# Patient Record
Sex: Male | Born: 1960 | Race: White | Hispanic: No | Marital: Married | Smoking: Current every day smoker
Health system: Southern US, Community
[De-identification: ages and names within clinical notes are randomized; demographics above are authoritative.]

## PROBLEM LIST (undated history)

## (undated) DIAGNOSIS — E785 Hyperlipidemia, unspecified: Secondary | ICD-10-CM

## (undated) DIAGNOSIS — I10 Essential (primary) hypertension: Secondary | ICD-10-CM

## (undated) DIAGNOSIS — K760 Fatty (change of) liver, not elsewhere classified: Secondary | ICD-10-CM

## (undated) DIAGNOSIS — K769 Liver disease, unspecified: Secondary | ICD-10-CM

## (undated) DIAGNOSIS — I2510 Atherosclerotic heart disease of native coronary artery without angina pectoris: Secondary | ICD-10-CM

## (undated) DIAGNOSIS — J449 Chronic obstructive pulmonary disease, unspecified: Secondary | ICD-10-CM

## (undated) DIAGNOSIS — I4510 Unspecified right bundle-branch block: Secondary | ICD-10-CM

## (undated) HISTORY — PX: OTHER SURGICAL HISTORY: SHX169

## (undated) HISTORY — PX: CYST EXCISION: SHX5701

---

## 2001-06-05 ENCOUNTER — Ambulatory Visit (HOSPITAL_BASED_OUTPATIENT_CLINIC_OR_DEPARTMENT_OTHER): Admission: RE | Admit: 2001-06-05 | Discharge: 2001-06-05 | Payer: Self-pay | Admitting: Plastic Surgery

## 2005-01-27 ENCOUNTER — Ambulatory Visit (HOSPITAL_BASED_OUTPATIENT_CLINIC_OR_DEPARTMENT_OTHER): Admission: RE | Admit: 2005-01-27 | Discharge: 2005-01-27 | Payer: Self-pay | Admitting: Plastic Surgery

## 2006-02-20 ENCOUNTER — Encounter: Admission: RE | Admit: 2006-02-20 | Discharge: 2006-02-20 | Payer: Self-pay | Admitting: Emergency Medicine

## 2010-06-14 ENCOUNTER — Other Ambulatory Visit: Payer: Self-pay | Admitting: Plastic Surgery

## 2011-04-18 ENCOUNTER — Other Ambulatory Visit: Payer: Self-pay | Admitting: Plastic Surgery

## 2011-09-05 ENCOUNTER — Other Ambulatory Visit: Payer: Self-pay | Admitting: Plastic Surgery

## 2012-11-21 ENCOUNTER — Other Ambulatory Visit: Payer: Self-pay | Admitting: Plastic Surgery

## 2013-06-28 ENCOUNTER — Ambulatory Visit: Payer: Self-pay | Admitting: Cardiology

## 2013-07-12 ENCOUNTER — Encounter: Payer: Self-pay | Admitting: Cardiology

## 2013-12-27 ENCOUNTER — Other Ambulatory Visit: Payer: Self-pay | Admitting: Gastroenterology

## 2015-05-25 ENCOUNTER — Other Ambulatory Visit: Payer: Self-pay | Admitting: Plastic Surgery

## 2015-07-14 ENCOUNTER — Other Ambulatory Visit: Payer: Self-pay | Admitting: Family Medicine

## 2015-07-14 DIAGNOSIS — D696 Thrombocytopenia, unspecified: Secondary | ICD-10-CM

## 2015-07-21 ENCOUNTER — Other Ambulatory Visit: Payer: Self-pay | Admitting: Family Medicine

## 2015-07-21 DIAGNOSIS — R0600 Dyspnea, unspecified: Secondary | ICD-10-CM

## 2015-07-22 ENCOUNTER — Ambulatory Visit (INDEPENDENT_AMBULATORY_CARE_PROVIDER_SITE_OTHER): Payer: BLUE CROSS/BLUE SHIELD | Admitting: Internal Medicine

## 2015-07-22 DIAGNOSIS — R0600 Dyspnea, unspecified: Secondary | ICD-10-CM | POA: Diagnosis not present

## 2015-07-22 LAB — PULMONARY FUNCTION TEST
DL/VA % pred: 77 %
DL/VA: 3.73 ml/min/mmHg/L
DLCO cor % pred: 80 %
DLCO cor: 29.18 ml/min/mmHg
DLCO unc % pred: 82 %
DLCO unc: 30.04 ml/min/mmHg
FEF 25-75 Post: 2.16 L/sec
FEF 25-75 Pre: 1.64 L/sec
FEF2575-%Change-Post: 31 %
FEF2575-%Pred-Post: 61 %
FEF2575-%Pred-Pre: 46 %
FEV1-%Change-Post: 10 %
FEV1-%Pred-Post: 83 %
FEV1-%Pred-Pre: 75 %
FEV1-Post: 3.49 L
FEV1-Pre: 3.15 L
FEV1FVC-%Change-Post: 10 %
FEV1FVC-%Pred-Pre: 78 %
FEV6-%Change-Post: 0 %
FEV6-%Pred-Post: 98 %
FEV6-%Pred-Pre: 98 %
FEV6-Post: 5.16 L
FEV6-Pre: 5.13 L
FEV6FVC-%Change-Post: 0 %
FEV6FVC-%Pred-Post: 102 %
FEV6FVC-%Pred-Pre: 102 %
FVC-%Change-Post: 0 %
FVC-%Pred-Post: 96 %
FVC-%Pred-Pre: 96 %
FVC-Post: 5.25 L
FVC-Pre: 5.23 L
Post FEV1/FVC ratio: 67 %
Post FEV6/FVC ratio: 98 %
Pre FEV1/FVC ratio: 60 %
Pre FEV6/FVC Ratio: 98 %
RV % pred: 147 %
RV: 3.39 L
TLC % pred: 116 %
TLC: 8.8 L

## 2015-07-22 NOTE — Progress Notes (Signed)
PFT done today. 

## 2015-07-27 ENCOUNTER — Ambulatory Visit
Admission: RE | Admit: 2015-07-27 | Discharge: 2015-07-27 | Disposition: A | Discharge status: Home / Self Care | Payer: BLUE CROSS/BLUE SHIELD | Source: Ambulatory Visit | Attending: Family Medicine | Admitting: Family Medicine

## 2015-07-27 DIAGNOSIS — D696 Thrombocytopenia, unspecified: Secondary | ICD-10-CM

## 2015-08-03 ENCOUNTER — Other Ambulatory Visit: Payer: Self-pay | Admitting: Family Medicine

## 2015-08-03 DIAGNOSIS — R160 Hepatomegaly, not elsewhere classified: Secondary | ICD-10-CM

## 2015-08-11 ENCOUNTER — Ambulatory Visit
Admission: RE | Admit: 2015-08-11 | Discharge: 2015-08-11 | Disposition: A | Discharge status: Home / Self Care | Payer: BLUE CROSS/BLUE SHIELD | Source: Ambulatory Visit | Attending: Family Medicine | Admitting: Family Medicine

## 2015-08-11 DIAGNOSIS — R160 Hepatomegaly, not elsewhere classified: Secondary | ICD-10-CM

## 2015-08-11 MED ORDER — GADOBENATE DIMEGLUMINE 529 MG/ML IV SOLN
17.0000 mL | Freq: Once | INTRAVENOUS | Status: AC | PRN
  Administered 2015-08-11: 17 mL via INTRAVENOUS

## 2016-07-14 ENCOUNTER — Other Ambulatory Visit: Payer: Self-pay | Admitting: Plastic Surgery

## 2016-08-26 ENCOUNTER — Telehealth: Payer: Self-pay | Admitting: Acute Care

## 2016-08-29 NOTE — Telephone Encounter (Signed)
LMTC x 1  

## 2016-09-05 NOTE — Telephone Encounter (Signed)
LMTC x 1  

## 2016-09-09 NOTE — Telephone Encounter (Signed)
Spoke with pt.  He is to call [employer] to check for coverage for [facility] and CT and will call me back.

## 2016-09-29 NOTE — Telephone Encounter (Signed)
Spoke with pt and he states he has not had time to contact [employer] and will try to do this asap.  Will await call back

## 2016-11-03 ENCOUNTER — Other Ambulatory Visit: Payer: Self-pay | Admitting: Acute Care

## 2016-11-03 DIAGNOSIS — F17210 Nicotine dependence, cigarettes, uncomplicated: Secondary | ICD-10-CM

## 2016-11-18 ENCOUNTER — Ambulatory Visit (INDEPENDENT_AMBULATORY_CARE_PROVIDER_SITE_OTHER): Payer: BLUE CROSS/BLUE SHIELD | Admitting: Acute Care

## 2016-11-18 ENCOUNTER — Ambulatory Visit
Admission: RE | Admit: 2016-11-18 | Discharge: 2016-11-18 | Disposition: A | Discharge status: Home / Self Care | Payer: BLUE CROSS/BLUE SHIELD | Source: Ambulatory Visit | Attending: Acute Care | Admitting: Acute Care

## 2016-11-18 ENCOUNTER — Encounter: Payer: Self-pay | Admitting: Acute Care

## 2016-11-18 DIAGNOSIS — F17210 Nicotine dependence, cigarettes, uncomplicated: Secondary | ICD-10-CM

## 2016-11-18 NOTE — Progress Notes (Signed)
Shared Decision Making Visit Lung Cancer Screening Program [id number])   Eligibility:  Age 56 y.o.  Pack Years Smoking History Calculation 80 pack year smoking history (# packs/per year x # years smoked)  Recent History of coughing up blood  no  Unexplained weight loss? no ( >Than 15 pounds within the last 6 months )  Prior History Lung / other cancer no (Diagnosis within the last 5 years already requiring surveillance chest CT Scans).  Smoking Status Current Smoker  Former Smokers: Years since quit: NA  Quit Date: NA  Visit Components:  Discussion included one or more decision making aids. yes  Discussion included risk/benefits of screening. yes  Discussion included potential follow up diagnostic testing for abnormal scans. yes  Discussion included meaning and risk of over diagnosis. yes  Discussion included meaning and risk of False Positives. yes  Discussion included meaning of total radiation exposure. yes  Counseling Included:  Importance of adherence to annual lung cancer LDCT screening. yes  Impact of comorbidities on ability to participate in the program. yes  Ability and willingness to under diagnostic treatment. yes  Smoking Cessation Counseling:  Current Smokers:   Discussed importance of smoking cessation. yes  Information about tobacco cessation classes and interventions provided to patient. yes  Patient provided with "ticket" for LDCT Scan. yes  Symptomatic Patient. no  Counseling  Diagnosis Code: Tobacco Use Z72.0  Asymptomatic Patient yes  Counseling (Intermediate counseling: > three minutes counseling) Y8144  Former Smokers:   Discussed the importance of maintaining cigarette abstinence. yes  Diagnosis Code: Personal History of Nicotine Dependence. Y18.563  Information about tobacco cessation classes and interventions provided to patient. Yes  Patient provided with "ticket" for LDCT Scan. yes  Written Order for Lung Cancer  Screening with LDCT placed in Epic. Yes (CT Chest Lung Cancer Screening Low Dose W/O CM) [id number] Z12.2-Screening of respiratory organs Z87.891-Personal history of nicotine dependence  I have spent 25 minutes of face to face time with Mr. Boulay discussing the risks and benefits of lung cancer screening. We viewed a power point together that explained in detail the above noted topics. We paused at intervals to allow for questions to be asked and answered to ensure understanding.We discussed that the single most powerful action that he can take to decrease his risk of developing lung cancer is to quit smoking. We discussed whether or not he is ready to commit to setting a quit date. We discussed options for tools to aid in quitting smoking including nicotine replacement therapy, non-nicotine medications, support groups, Quit Smart classes, and behavior modification. We discussed that often times setting smaller, more achievable goals, such as eliminating 1 cigarette a day for a week and then 2 cigarettes a day for a week can be helpful in slowly decreasing the number of cigarettes smoked. This allows for a sense of accomplishment as well as providing a clinical benefit. I gave him the " Be Stronger Than Your Excuses" card with contact information for community resources, classes, free nicotine replacement therapy, and access to mobile apps, text messaging, and on-line smoking cessation help. I have also given him my card and contact information in the event he needs to contact me. We discussed the time and location of the scan, and that either Doroteo Glassman RN or I will call with the results within 24-48 hours of receiving them. I have offered him  a copy of the power point we viewed  as a resource in the event they need reinforcement of  the concepts we discussed today in the office. The patient verbalized understanding of all of  the above and had no further questions upon leaving the office. They have my  contact information in the event they have any further questions.  I spent 4 minutes counseling on smoking cessation and the health risks of continued tobacco abuse.  I explained to the patient that there has been a high incidence of coronary artery disease noted on these exams. I explained that this is a non-gated exam therefore degree or severity cannot be determined. This patient is not on statin therapy. I have asked the patient to follow-up with their PCP regarding any incidental finding of coronary artery disease and management with diet or medication as their PCP  feels is clinically indicated. The patient verbalized understanding of the above and had no further questions upon completion of the visit.      Magdalen Spatz, NP 11/18/2016

## 2016-11-24 ENCOUNTER — Other Ambulatory Visit: Payer: Self-pay | Admitting: Acute Care

## 2016-11-24 DIAGNOSIS — F17210 Nicotine dependence, cigarettes, uncomplicated: Secondary | ICD-10-CM

## 2017-04-16 ENCOUNTER — Observation Stay (HOSPITAL_COMMUNITY)
Admission: EM | Admit: 2017-04-16 | Discharge: 2017-04-17 | Disposition: A | Discharge status: Home / Self Care | Payer: BLUE CROSS/BLUE SHIELD | Attending: Internal Medicine | Admitting: Internal Medicine

## 2017-04-16 ENCOUNTER — Encounter (HOSPITAL_COMMUNITY): Payer: Self-pay | Admitting: *Deleted

## 2017-04-16 ENCOUNTER — Other Ambulatory Visit: Payer: Self-pay

## 2017-04-16 DIAGNOSIS — I10 Essential (primary) hypertension: Secondary | ICD-10-CM | POA: Insufficient documentation

## 2017-04-16 DIAGNOSIS — I4510 Unspecified right bundle-branch block: Secondary | ICD-10-CM | POA: Diagnosis not present

## 2017-04-16 DIAGNOSIS — F17210 Nicotine dependence, cigarettes, uncomplicated: Secondary | ICD-10-CM | POA: Diagnosis not present

## 2017-04-16 DIAGNOSIS — Z79899 Other long term (current) drug therapy: Secondary | ICD-10-CM | POA: Insufficient documentation

## 2017-04-16 DIAGNOSIS — I9589 Other hypotension: Secondary | ICD-10-CM | POA: Diagnosis not present

## 2017-04-16 DIAGNOSIS — I959 Hypotension, unspecified: Secondary | ICD-10-CM | POA: Diagnosis not present

## 2017-04-16 DIAGNOSIS — E860 Dehydration: Secondary | ICD-10-CM | POA: Diagnosis present

## 2017-04-16 DIAGNOSIS — I2510 Atherosclerotic heart disease of native coronary artery without angina pectoris: Secondary | ICD-10-CM | POA: Insufficient documentation

## 2017-04-16 DIAGNOSIS — E876 Hypokalemia: Secondary | ICD-10-CM | POA: Insufficient documentation

## 2017-04-16 DIAGNOSIS — K529 Noninfective gastroenteritis and colitis, unspecified: Secondary | ICD-10-CM | POA: Diagnosis not present

## 2017-04-16 DIAGNOSIS — F1090 Alcohol use, unspecified, uncomplicated: Secondary | ICD-10-CM | POA: Diagnosis present

## 2017-04-16 DIAGNOSIS — E785 Hyperlipidemia, unspecified: Secondary | ICD-10-CM | POA: Insufficient documentation

## 2017-04-16 DIAGNOSIS — E861 Hypovolemia: Secondary | ICD-10-CM

## 2017-04-16 DIAGNOSIS — D696 Thrombocytopenia, unspecified: Secondary | ICD-10-CM | POA: Diagnosis not present

## 2017-04-16 DIAGNOSIS — Z789 Other specified health status: Secondary | ICD-10-CM

## 2017-04-16 DIAGNOSIS — J449 Chronic obstructive pulmonary disease, unspecified: Secondary | ICD-10-CM | POA: Diagnosis present

## 2017-04-16 DIAGNOSIS — N179 Acute kidney failure, unspecified: Secondary | ICD-10-CM | POA: Diagnosis not present

## 2017-04-16 DIAGNOSIS — E871 Hypo-osmolality and hyponatremia: Secondary | ICD-10-CM | POA: Diagnosis not present

## 2017-04-16 DIAGNOSIS — E872 Acidosis, unspecified: Secondary | ICD-10-CM | POA: Diagnosis present

## 2017-04-16 DIAGNOSIS — Z720 Tobacco use: Secondary | ICD-10-CM | POA: Diagnosis not present

## 2017-04-16 DIAGNOSIS — Z7289 Other problems related to lifestyle: Secondary | ICD-10-CM | POA: Diagnosis present

## 2017-04-16 HISTORY — DX: Essential (primary) hypertension: I10

## 2017-04-16 HISTORY — DX: Hyperlipidemia, unspecified: E78.5

## 2017-04-16 HISTORY — DX: Liver disease, unspecified: K76.9

## 2017-04-16 HISTORY — DX: Unspecified right bundle-branch block: I45.10

## 2017-04-16 HISTORY — DX: Chronic obstructive pulmonary disease, unspecified: J44.9

## 2017-04-16 HISTORY — DX: Fatty (change of) liver, not elsewhere classified: K76.0

## 2017-04-16 HISTORY — DX: Atherosclerotic heart disease of native coronary artery without angina pectoris: I25.10

## 2017-04-16 LAB — COMPREHENSIVE METABOLIC PANEL
ALT: 23 U/L (ref 17–63)
AST: 26 U/L (ref 15–41)
Albumin: 3.7 g/dL (ref 3.5–5.0)
Alkaline Phosphatase: 70 U/L (ref 38–126)
Anion gap: 23 — ABNORMAL HIGH (ref 5–15)
BUN: 74 mg/dL — ABNORMAL HIGH (ref 6–20)
CO2: 17 mmol/L — ABNORMAL LOW (ref 22–32)
Calcium: 8.9 mg/dL (ref 8.9–10.3)
Chloride: 86 mmol/L — ABNORMAL LOW (ref 101–111)
Creatinine, Ser: 6.71 mg/dL — ABNORMAL HIGH (ref 0.61–1.24)
GFR calc Af Amer: 10 mL/min — ABNORMAL LOW (ref >60)
GFR calc non Af Amer: 8 mL/min — ABNORMAL LOW (ref >60)
Glucose, Bld: 124 mg/dL — ABNORMAL HIGH (ref 65–99)
Potassium: 3.6 mmol/L (ref 3.5–5.1)
Sodium: 126 mmol/L — ABNORMAL LOW (ref 135–145)
Total Bilirubin: 0.7 mg/dL (ref 0.3–1.2)
Total Protein: 8.2 g/dL — ABNORMAL HIGH (ref 6.5–8.1)

## 2017-04-16 LAB — BASIC METABOLIC PANEL
Anion gap: 18 — ABNORMAL HIGH (ref 5–15)
BUN: 73 mg/dL — ABNORMAL HIGH (ref 6–20)
CO2: 18 mmol/L — ABNORMAL LOW (ref 22–32)
Calcium: 8.3 mg/dL — ABNORMAL LOW (ref 8.9–10.3)
Chloride: 93 mmol/L — ABNORMAL LOW (ref 101–111)
Creatinine, Ser: 5.22 mg/dL — ABNORMAL HIGH (ref 0.61–1.24)
GFR calc Af Amer: 13 mL/min — ABNORMAL LOW (ref >60)
GFR calc non Af Amer: 11 mL/min — ABNORMAL LOW (ref >60)
Glucose, Bld: 110 mg/dL — ABNORMAL HIGH (ref 65–99)
Potassium: 3.6 mmol/L (ref 3.5–5.1)
Sodium: 129 mmol/L — ABNORMAL LOW (ref 135–145)

## 2017-04-16 LAB — CBC
HCT: 40.5 % (ref 39.0–52.0)
Hemoglobin: 14.9 g/dL (ref 13.0–17.0)
MCH: 35.5 pg — ABNORMAL HIGH (ref 26.0–34.0)
MCHC: 36.8 g/dL — ABNORMAL HIGH (ref 30.0–36.0)
MCV: 96.4 fL (ref 78.0–100.0)
Platelets: 203 10*3/uL (ref 150–400)
RBC: 4.2 MIL/uL — ABNORMAL LOW (ref 4.22–5.81)
RDW: 12.7 % (ref 11.5–15.5)
WBC: 10.2 10*3/uL (ref 4.0–10.5)

## 2017-04-16 LAB — LIPASE, BLOOD: Lipase: 75 U/L — ABNORMAL HIGH (ref 11–51)

## 2017-04-16 LAB — I-STAT CG4 LACTIC ACID, ED: Lactic Acid, Venous: 1.06 mmol/L (ref 0.5–1.9)

## 2017-04-16 MED ORDER — SODIUM CHLORIDE 0.9 % IV SOLN
INTRAVENOUS | Status: AC
  Administered 2017-04-16: 19:00:00 via INTRAVENOUS

## 2017-04-16 MED ORDER — ATORVASTATIN CALCIUM 10 MG PO TABS
10.0000 mg | ORAL_TABLET | Freq: Every day | ORAL | Status: DC
  Administered 2017-04-17: 10 mg via ORAL
  Filled 2017-04-16: qty 1

## 2017-04-16 MED ORDER — NICOTINE 21 MG/24HR TD PT24
21.0000 mg | MEDICATED_PATCH | Freq: Every day | TRANSDERMAL | Status: DC
  Filled 2017-04-16: qty 1

## 2017-04-16 MED ORDER — ACETAMINOPHEN 650 MG RE SUPP
650.0000 mg | Freq: Four times a day (QID) | RECTAL | Status: DC | PRN
Start: 2017-04-16 — End: 2017-04-17

## 2017-04-16 MED ORDER — THIAMINE HCL 100 MG/ML IJ SOLN
Freq: Once | INTRAVENOUS | Status: AC
  Administered 2017-04-16: 21:00:00 via INTRAVENOUS
  Filled 2017-04-16: qty 1000

## 2017-04-16 MED ORDER — BOOST / RESOURCE BREEZE PO LIQD CUSTOM
1.0000 | Freq: Three times a day (TID) | ORAL | Status: DC
  Filled 2017-04-16 (×6): qty 1

## 2017-04-16 MED ORDER — THIAMINE HCL 100 MG/ML IJ SOLN: 100.0000 mg | Freq: Every day | INTRAMUSCULAR | Status: DC

## 2017-04-16 MED ORDER — SODIUM CHLORIDE 0.9 % IV BOLUS (SEPSIS)
1000.0000 mL | Freq: Once | INTRAVENOUS | Status: AC
  Administered 2017-04-16: 1000 mL via INTRAVENOUS

## 2017-04-16 MED ORDER — ONDANSETRON HCL 4 MG PO TABS
4.0000 mg | ORAL_TABLET | Freq: Four times a day (QID) | ORAL | Status: DC | PRN
  Administered 2017-04-16 – 2017-04-17 (×2): 4 mg via ORAL
  Filled 2017-04-16 (×2): qty 1

## 2017-04-16 MED ORDER — VITAMIN B-1 100 MG PO TABS
100.0000 mg | ORAL_TABLET | Freq: Every day | ORAL | Status: DC
  Administered 2017-04-17: 100 mg via ORAL
  Filled 2017-04-16: qty 1

## 2017-04-16 MED ORDER — ENOXAPARIN SODIUM 30 MG/0.3ML ~~LOC~~ SOLN
30.0000 mg | SUBCUTANEOUS | Status: DC
  Administered 2017-04-16: 30 mg via SUBCUTANEOUS
  Filled 2017-04-16: qty 0.3

## 2017-04-16 MED ORDER — ONDANSETRON HCL 4 MG/2ML IJ SOLN: 4.0000 mg | Freq: Four times a day (QID) | INTRAMUSCULAR | Status: DC | PRN

## 2017-04-16 MED ORDER — ONDANSETRON HCL 4 MG/2ML IJ SOLN: 4.0000 mg | Freq: Once | INTRAMUSCULAR | Status: DC | PRN

## 2017-04-16 MED ORDER — ACETAMINOPHEN 325 MG PO TABS: 650.0000 mg | ORAL_TABLET | Freq: Four times a day (QID) | ORAL | Status: DC | PRN

## 2017-04-16 MED ORDER — SODIUM CHLORIDE 0.9 % IV SOLN: INTRAVENOUS | Status: DC

## 2017-04-16 MED ORDER — FOLIC ACID 1 MG PO TABS
1.0000 mg | ORAL_TABLET | Freq: Every day | ORAL | Status: DC
  Administered 2017-04-17: 1 mg via ORAL
  Filled 2017-04-16: qty 1

## 2017-04-16 MED ORDER — ADULT MULTIVITAMIN W/MINERALS CH
1.0000 | ORAL_TABLET | Freq: Every day | ORAL | Status: DC
  Administered 2017-04-17: 1 via ORAL
  Filled 2017-04-16: qty 1

## 2017-04-16 MED ORDER — LORAZEPAM 2 MG/ML IJ SOLN: 1.0000 mg | Freq: Four times a day (QID) | INTRAMUSCULAR | Status: DC | PRN

## 2017-04-16 MED ORDER — ALBUTEROL SULFATE (2.5 MG/3ML) 0.083% IN NEBU: 2.5000 mg | INHALATION_SOLUTION | RESPIRATORY_TRACT | Status: DC | PRN

## 2017-04-16 MED ORDER — OXYCODONE HCL 5 MG PO TABS: 5.0000 mg | ORAL_TABLET | ORAL | Status: DC | PRN

## 2017-04-16 MED ORDER — LORAZEPAM 1 MG PO TABS
1.0000 mg | ORAL_TABLET | Freq: Four times a day (QID) | ORAL | Status: DC | PRN
  Administered 2017-04-16 – 2017-04-17 (×2): 1 mg via ORAL
  Filled 2017-04-16 (×2): qty 1

## 2017-04-16 NOTE — ED Triage Notes (Signed)
The pt is c/o n v diarrhea since Wednesday with chills  He was a little better this am  The approx 1400 he tried to eat but was unable to hold food down  He feels dehydrated  bp low

## 2017-04-16 NOTE — ED Notes (Signed)
Hospitalist at bedside 

## 2017-04-16 NOTE — H&P (Addendum)
History and Physical    Randy DIDONATO YQM:[id number] DOB: [birth year] DOA: 04/16/2017  PCP: Lujean Amel, MD   I have briefly reviewed patients previous medical reports in [facility].  Patient coming from: Home  Chief Complaint: Abdominal pain, nausea, vomiting, diarrhea.  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 History was obtained from patient and his spouse and daughter at bedside. Patient was in his usual state of health until 4 days ago. He ate a fish sandwich with tar tar sauce at noontime on Wednesday prior to admission. Approximately 4 hours later, he started feeling abdominal discomfort. Over the course of that night, he developed severe diffuse cramping abdominal pain associated with severe chills but no nausea, vomiting, fever. He was unable to sleep that night and did not eat anything. The next day he developed severe and multiple episodes of diarrhea which was watery, yellowish discoloration without mucus or blood, multiple episodes over the next 24 hours. Simultaneously his abdominal pain was gradually decreasing. His diarrhea then subsided but he started having nonbloody emesis every couple of hours. He tried to stay hydrated by drinking electrolyte drinks. 2 nights ago he felt somewhat better and was able to sleep through the course of the night without vomiting or diarrhea. Since yesterday when he tried to eat or drink anything other than water, he would develop nausea and vomiting and unable to keep anything down. He's been drinking only water and tea with sugar. He has been taking his prescription antihypertensives. His last episode of diarrhea was this morning and vomiting earlier in the afternoon. He currently feels hungry. He feels weak,  at times dizzy and lightheaded but no passing out. His urine output has progressively decreased over the last couple of days. His wife and daughter insisted on him coming to the ED for evaluation yesterday but patient declined. Since he was unable to keep anything down, he finally came to the ED.  ED Course: Noted to be hypotensive and was resuscitated with 2 L of normal saline and blood pressures have improved from SBP of 80s to 113/50 mmHg at bedside. Lab work significant for sodium of 126 and creatinine greater than 6. Overall patient's feels better.  Review of Systems:  All other systems reviewed and apart from HPI, are negative. No chest pain, dyspnea or palpitations.  Past Medical History:  Diagnosis Date  . Coronary artery disease   . Hypertension   . Liver disease     History reviewed. No pertinent surgical history.  Social History  reports that he has been smoking.  He has a 80.00 pack-year smoking history. he has never used smokeless tobacco. He reports that he drinks alcohol. His drug history is not on file.   Patient smokes 1.5 pack of cigarettes per day for several years. He drinks a sixpack of beers daily and has been doing so since the mid 80s. He denies drinking more on weekends or during hard liquor. He denies drug abuse. He last drank alcohol 4 days ago.  No Known Allergies  History reviewed. No pertinent family history. Father: Liver cancer. Lung Cancer.  Mother: Lung cancer Parents died of Heart Attack at age 26. Another brother had a stroke at age 57.  Prior to Admission medications  Medication Sig Start Date End Date Taking? Authorizing Provider  atorvastatin (LIPITOR) 10 MG tablet Take 10 mg by mouth daily. 03/25/17  Yes [provider]  fluticasone (FLONASE) 50 MCG/ACT nasal spray Place 2 sprays into both nostrils daily as needed for rhinitis.  04/12/17  Yes [provider]  irbesartan (AVAPRO) 75 MG tablet Take 75 mg by mouth daily. 03/14/17   Yes [provider]    Physical Exam: Vitals:   04/16/17 1715 04/16/17 1730 04/16/17 1745 04/16/17 1800  BP: 105/87 (!) 113/50 112/71 113/75  Pulse: 93 91 90 88  Resp: 18 18 17 14   Temp:      TempSrc:      SpO2: 100% 99% 100% 100%  Weight:      Height:          Constitutional: Pleasant middle-aged male moderately built and nourished lying comfortably supine in bed. Does not look septic or toxic. Eyes: PERTLA, lids and conjunctivae normal ENMT: Mucous membranes are dry. Posterior pharynx clear of any exudate or lesions. Normal dentition.  Neck: supple, no masses, no thyromegaly Respiratory: clear to auscultation bilaterally, no wheezing, no crackles. Normal respiratory effort. No accessory muscle use.  Cardiovascular: S1 & S2 heard, regular rate and rhythm, no murmurs / rubs / gallops. No extremity edema. 2+ pedal pulses. No carotid bruits. Telemetry: SR. Abdomen: No distension, no tenderness, no masses palpated. No hepatosplenomegaly. Bowel sounds normal.  Musculoskeletal: no clubbing / cyanosis. No joint deformity upper and lower extremities. Good ROM, no contractures. Normal muscle tone.  Skin: no rashes, lesions, ulcers. No induration Neurologic: CN 2-12 grossly intact. Sensation intact, DTR normal. Strength 5/5 in all 4 limbs.  Psychiatric: Normal judgment and insight. Alert and oriented x 3. Normal mood.     Labs on Admission: I have personally reviewed following labs and imaging studies  CBC: Recent Labs  Lab 04/16/17 1520  WBC 10.2  HGB 14.9  HCT 40.5  MCV 96.4  PLT 182   Basic Metabolic Panel: Recent Labs  Lab 04/16/17 1520  NA 126*  K 3.6  CL 86*  CO2 17*  GLUCOSE 124*  BUN 74*  CREATININE 6.71*  CALCIUM 8.9   Liver Function Tests: Recent Labs  Lab 04/16/17 1520  AST 26  ALT 23  ALKPHOS 70  BILITOT 0.7  PROT 8.2*  ALBUMIN 3.7     Radiological Exams on Admission: No results found.  EKG: Independently reviewed. SR, RBBB (not  new), QTC 356 msecs. No acute changes.  Assessment/Plan Principal Problem:   Acute renal failure (ARF) (HCC) Active Problems:   Acute gastroenteritis   Dehydration with hyponatremia   Metabolic acidosis   Tobacco abuse   Alcohol use   RBBB   Hypotension   Essential hypertension   COPD (chronic obstructive pulmonary disease) ([place])     1. Acute renal failure, possibly oliguric: Precipitated by dehydration from GI losses, possible ATN from hypotension and ARB. Presented with creatinine of 6.7 (creatinine was 0.9 in early October 2018 as seen in patients my chart). Hold ARB. Check FeNA. IV fluids, strict intake output charting and follow BMP every 6 hourly. Expect full recovery. If does not improve or worsens, consider renal ultrasound and nephrology consultation. Metabolic acidosis likely related to this. No hyperkalemia or other indications for urgent dialysis. 2. Anion gap metabolic acidosis: Anion gap 23 on admission. Lactate normal. Likely related to acute renal failure. Management as above. Follow BMP. 3. Acute gastroenteritis versus food poisoning: Clinically improving. No history of  antibiotic exposure. Start with clear liquid diet and advance as tolerated. Enteric precautions. If diarrhea or vomiting get worse, then consider testing. 4. Dehydration with hyponatremia: Patient and family report chronic hyponatremia but last sodium in October 2018 was 135. IV normal saline and follow BMP every 6 hours. Aim to correct sodium by no greater than 8 - 10 mEq in 24 hours. 5. Hypotension: Secondary to dehydration and antihypertensives. Hold ARB. Improving with hydration. 6. Hyperlipidemia: Continue statins. 7. Alcohol use: CIWA protocol. 8. Tobacco abuse: Cessation counseled. He agrees to nicotine patch. 9. COPD: Stable without clinical bronchospasm.   DVT prophylaxis: Lovenox  Code Status: Full  Family Communication: Discussed in detail with patient's spouse and daughter at bedside.    Disposition Plan: DC home when medically improved.  Consults called: None  Admission status: Observation, medical bed.  Severity of Illness: The appropriate patient status for this patient is OBSERVATION. Observation status is judged to be reasonable and necessary in order to provide the required intensity of service to ensure the patient's safety. The patient's presenting symptoms, physical exam findings, and initial radiographic and laboratory data in the context of their medical condition is felt to place them at decreased risk for further clinical deterioration. Furthermore, it is anticipated that the patient will be medically stable for discharge from the hospital within 2 midnights of admission. The following factors support the patient status of observation.   " The patient's presenting symptoms include nausea, vomiting, diarrhea, abdominal pain and chills. " The physical exam findings include dry mucosa, hypotensive in ED. " The initial radiographic and laboratory data are sodium 126, bicarbonate 17, anion gap 23, BUN 74, creatinine 6.7.       Vernell Leep MD [facility] Pager [phone]  If 7PM-7AM, please contact night-coverage [email or web address] Password [email or web address]  04/16/2017, 6:18 PM

## 2017-04-16 NOTE — ED Provider Notes (Signed)
[facility] EMERGENCY DEPARTMENT Provider Note   CSN: [id number] Arrival date & time: 04/16/17  1452     History   Chief Complaint Chief Complaint  Patient presents with  . Abdominal Pain    HPI Randy Davidson is a 56 y.o. male.  HPI  56 year old male presents with diarrhea and vomiting.  He states on 12/12 he had a fish sandwich at [employer].  A couple hours later he developed severe chills and abdominal pain.  Next day he developed copious amounts of diarrhea and then vomiting yesterday.  He was starting to feel better today and had significantly less nausea and was able to drink some fluids and even half an egg.  However then he started vomiting again so he came to the ED to be checked out.  He does have a history of hypertension and did take his blood pressure medicine this morning.  He has been feeling some dizziness.  The only thing that seems to stay down his water.  He states now the abdominal pain is much milder and is lower and sharp.  It only comes prior to having a bowel movement or prior to vomiting.  He is currently not nauseated.  He has not had any fevers during this time.  No chest pain or shortness of breath.  He has not been on antibiotics, no travel or camping.  No other sick contacts including his wife.  Past Medical History:  Diagnosis Date  . Coronary artery disease   . Hypertension   . Liver disease     There are no active problems to display for this patient.   History reviewed. No pertinent surgical history.     Home Medications    Prior to Admission medications   Medication Sig Start Date End Date Taking? Authorizing Provider  atorvastatin (LIPITOR) 10 MG tablet Take 10 mg by mouth daily. 03/25/17  Yes [provider]  fluticasone (FLONASE) 50 MCG/ACT nasal spray Place 2 sprays into both nostrils daily as needed for rhinitis.  04/12/17  Yes [provider]  irbesartan (AVAPRO) 75 MG tablet Take 75 mg by  mouth daily. 03/14/17  Yes [provider]    Family History No family history on file.  Social History Social History   Tobacco Use  . Smoking status: Current Every Day Smoker    Packs/day: 2.00    Years: 40.00    Pack years: 80.00  . Smokeless tobacco: Never Used  Substance Use Topics  . Alcohol use: Yes  . Drug use: Not on file     Allergies   Patient has no known allergies.   Review of Systems Review of Systems  Constitutional: Positive for chills. Negative for fever.  Respiratory: Negative for shortness of breath.   Cardiovascular: Negative for chest pain.  Gastrointestinal: Positive for abdominal pain, diarrhea, nausea and vomiting. Negative for blood in stool.  Neurological: Positive for dizziness.  All other systems reviewed and are negative.    Physical Exam Updated Vital Signs BP (!) 80/69   Pulse 100   Temp (!) 97.4 F (36.3 C) (Oral)   Resp 19   Ht 6\' 1"  (1.854 m)   Wt 81.6 kg (180 lb)   SpO2 99%   BMI 23.75 kg/m   Physical Exam  Constitutional: He is oriented to person, place, and time. He appears well-developed and well-nourished. No distress.  Resting comfortably, no distress  HENT:  Head: Normocephalic and atraumatic.  Right Ear: External ear normal.  Left Ear: External ear normal.  Nose: Nose normal.  Mouth/Throat: Mucous membranes are dry.  Eyes: Right eye exhibits no discharge. Left eye exhibits no discharge.  Neck: Neck supple.  Cardiovascular: Regular rhythm and normal heart sounds. Tachycardia present.  Pulses:      Radial pulses are 2+ on the right side, and 2+ on the left side.  HR low 100s  Pulmonary/Chest: Effort normal and breath sounds normal.  Abdominal: Soft. He exhibits no distension. There is no tenderness.  Musculoskeletal: He exhibits no edema.  Neurological: He is alert and oriented to person, place, and time.  Skin: Skin is warm and dry. He is not diaphoretic.  Nursing note and vitals reviewed.    ED  Treatments / Results  Labs (all labs ordered are listed, but only abnormal results are displayed) Labs Reviewed  LIPASE, BLOOD - Abnormal; Notable for the following components:      Result Value   Lipase 75 (*)    All other components within normal limits  COMPREHENSIVE METABOLIC PANEL - Abnormal; Notable for the following components:   Sodium 126 (*)    Chloride 86 (*)    CO2 17 (*)    Glucose, Bld 124 (*)    BUN 74 (*)    Creatinine, Ser 6.71 (*)    Total Protein 8.2 (*)    GFR calc non Af Amer 8 (*)    GFR calc Af Amer 10 (*)    Anion gap 23 (*)    All other components within normal limits  CBC - Abnormal; Notable for the following components:   RBC 4.20 (*)    MCH 35.5 (*)    MCHC 36.8 (*)    All other components within normal limits  C DIFFICILE QUICK SCREEN W PCR REFLEX  GASTROINTESTINAL PANEL BY PCR, STOOL (REPLACES STOOL CULTURE)  URINALYSIS, ROUTINE W REFLEX MICROSCOPIC  I-STAT CG4 LACTIC ACID, ED    EKG  EKG Interpretation  Date/Time:  Sunday April 16 2017 15:19:04 EST Ventricular Rate:  99 PR Interval:  170 QRS Duration: 96 QT Interval:  356 QTC Calculation: 456 R Axis:   95 Text Interpretation:  Normal sinus rhythm Rightward axis RSR' or QR pattern in V1 suggests right ventricular conduction delay Inferior infarct , age undetermined T wave abnormality, consider anterior ischemia Abnormal ECG No old tracing to compare Confirmed by Sherwood Gambler [phone]) on 04/16/2017 3:39:22 PM       Radiology No results found.  Procedures .Critical Care Performed by: Sherwood Gambler, MD Authorized by: Sherwood Gambler, MD   Critical care provider statement:    Critical care time (minutes):  30   Critical care was necessary to treat or prevent imminent or life-threatening deterioration of the following conditions:  Dehydration, renal failure and shock   Critical care was time spent personally by me on the following activities:  Development of treatment plan  with patient or surrogate, discussions with consultants, evaluation of patient's response to treatment, examination of patient, obtaining history from patient or surrogate, ordering and performing treatments and interventions, ordering and review of laboratory studies, ordering and review of radiographic studies, pulse oximetry and re-evaluation of patient's condition   (including critical care time)  Medications Ordered in ED Medications  sodium chloride 0.9 % bolus 1,000 mL (not administered)  sodium chloride 0.9 % bolus 1,000 mL (not administered)  ondansetron (ZOFRAN) injection 4 mg (not administered)     Initial Impression / Assessment and Plan / ED Course  I have reviewed  the triage vital signs and the nursing notes.  Pertinent labs & imaging results that were available during my care of the patient were reviewed by me and considered in my medical decision making (see chart for details).     Patient's workup shows significant renal failure with a creatinine of 6.7.  His PCP is at [facility] and while I cannot find his labs and care everywhere, he pulled up my chart and showed me that in October his creatinine was 0.9.  I think this is all from hypovolemia from significant gastroenteritis.  I have ordered stool studies although he has not otherwise high risk for C. difficile or other invasive disease.  He clinically appears well and after IV fluids his blood pressure has come up and is normal on repeated measurements.  His abdominal exam is benign and I do not think imaging is needed.  However he will need significant IV fluids.  Discussed with hospitalist, Dr. Algis Liming, who will admit  Final Clinical Impressions(s) / ED Diagnoses   Final diagnoses:  Acute gastroenteritis  Acute kidney injury ([place])  Hypovolemia due to dehydration    ED Discharge Orders    None       Sherwood Gambler, MD 04/16/17 1739

## 2017-04-17 ENCOUNTER — Encounter (INDEPENDENT_AMBULATORY_CARE_PROVIDER_SITE_OTHER): Payer: Self-pay

## 2017-04-17 DIAGNOSIS — I10 Essential (primary) hypertension: Secondary | ICD-10-CM

## 2017-04-17 DIAGNOSIS — J449 Chronic obstructive pulmonary disease, unspecified: Secondary | ICD-10-CM | POA: Diagnosis not present

## 2017-04-17 DIAGNOSIS — N179 Acute kidney failure, unspecified: Secondary | ICD-10-CM | POA: Diagnosis not present

## 2017-04-17 DIAGNOSIS — K529 Noninfective gastroenteritis and colitis, unspecified: Secondary | ICD-10-CM | POA: Diagnosis not present

## 2017-04-17 DIAGNOSIS — Z789 Other specified health status: Secondary | ICD-10-CM | POA: Diagnosis not present

## 2017-04-17 LAB — HIV ANTIBODY (ROUTINE TESTING W REFLEX): HIV Screen 4th Generation wRfx: NONREACTIVE

## 2017-04-17 LAB — BASIC METABOLIC PANEL
Anion gap: 12 (ref 5–15)
Anion gap: 11 (ref 5–15)
Anion gap: 12 (ref 5–15)
BUN: 67 mg/dL — ABNORMAL HIGH (ref 6–20)
BUN: 57 mg/dL — ABNORMAL HIGH (ref 6–20)
BUN: 51 mg/dL — ABNORMAL HIGH (ref 6–20)
CO2: 23 mmol/L (ref 22–32)
CO2: 21 mmol/L — ABNORMAL LOW (ref 22–32)
CO2: 23 mmol/L (ref 22–32)
Calcium: 8.3 mg/dL — ABNORMAL LOW (ref 8.9–10.3)
Calcium: 8.3 mg/dL — ABNORMAL LOW (ref 8.9–10.3)
Calcium: 8.5 mg/dL — ABNORMAL LOW (ref 8.9–10.3)
Chloride: 94 mmol/L — ABNORMAL LOW (ref 101–111)
Chloride: 99 mmol/L — ABNORMAL LOW (ref 101–111)
Chloride: 96 mmol/L — ABNORMAL LOW (ref 101–111)
Creatinine, Ser: 3.48 mg/dL — ABNORMAL HIGH (ref 0.61–1.24)
Creatinine, Ser: 2.3 mg/dL — ABNORMAL HIGH (ref 0.61–1.24)
Creatinine, Ser: 1.69 mg/dL — ABNORMAL HIGH (ref 0.61–1.24)
GFR calc Af Amer: 21 mL/min — ABNORMAL LOW (ref >60)
GFR calc Af Amer: 35 mL/min — ABNORMAL LOW (ref >60)
GFR calc Af Amer: 51 mL/min — ABNORMAL LOW (ref >60)
GFR calc non Af Amer: 18 mL/min — ABNORMAL LOW (ref >60)
GFR calc non Af Amer: 30 mL/min — ABNORMAL LOW (ref >60)
GFR calc non Af Amer: 44 mL/min — ABNORMAL LOW (ref >60)
Glucose, Bld: 99 mg/dL (ref 65–99)
Glucose, Bld: 97 mg/dL (ref 65–99)
Glucose, Bld: 104 mg/dL — ABNORMAL HIGH (ref 65–99)
Potassium: 3.3 mmol/L — ABNORMAL LOW (ref 3.5–5.1)
Potassium: 3.1 mmol/L — ABNORMAL LOW (ref 3.5–5.1)
Potassium: 3.3 mmol/L — ABNORMAL LOW (ref 3.5–5.1)
Sodium: 129 mmol/L — ABNORMAL LOW (ref 135–145)
Sodium: 131 mmol/L — ABNORMAL LOW (ref 135–145)
Sodium: 131 mmol/L — ABNORMAL LOW (ref 135–145)

## 2017-04-17 LAB — CBC
HCT: 35.9 % — ABNORMAL LOW (ref 39.0–52.0)
Hemoglobin: 13.1 g/dL (ref 13.0–17.0)
MCH: 34.5 pg — ABNORMAL HIGH (ref 26.0–34.0)
MCHC: 36.5 g/dL — ABNORMAL HIGH (ref 30.0–36.0)
MCV: 94.5 fL (ref 78.0–100.0)
Platelets: 143 10*3/uL — ABNORMAL LOW (ref 150–400)
RBC: 3.8 MIL/uL — ABNORMAL LOW (ref 4.22–5.81)
RDW: 12.2 % (ref 11.5–15.5)
WBC: 7.9 10*3/uL (ref 4.0–10.5)

## 2017-04-17 LAB — SODIUM, URINE, RANDOM: Sodium, Ur: 18 mmol/L

## 2017-04-17 LAB — CREATININE, URINE, RANDOM: Creatinine, Urine: 185.64 mg/dL

## 2017-04-17 MED ORDER — SODIUM CHLORIDE 0.9 % IV SOLN: INTRAVENOUS | Status: DC

## 2017-04-17 MED ORDER — POTASSIUM CHLORIDE CRYS ER 20 MEQ PO TBCR
40.0000 meq | EXTENDED_RELEASE_TABLET | Freq: Once | ORAL | Status: AC
  Administered 2017-04-17: 40 meq via ORAL
  Filled 2017-04-17: qty 2

## 2017-04-17 MED ORDER — ENOXAPARIN SODIUM 40 MG/0.4ML ~~LOC~~ SOLN: 40.0000 mg | SUBCUTANEOUS | Status: DC

## 2017-04-17 MED ORDER — PANTOPRAZOLE SODIUM 40 MG PO TBEC: 40.0000 mg | DELAYED_RELEASE_TABLET | Freq: Every day | ORAL | 0 refills | Status: DC

## 2017-04-17 MED ORDER — THIAMINE HCL 100 MG PO TABS: 100.0000 mg | ORAL_TABLET | Freq: Every day | ORAL | 0 refills | Status: DC

## 2017-04-17 MED ORDER — NICOTINE 21 MG/24HR TD PT24: 21.0000 mg | MEDICATED_PATCH | Freq: Every day | TRANSDERMAL | 0 refills | Status: DC

## 2017-04-17 MED ORDER — PANTOPRAZOLE SODIUM 40 MG PO TBEC
40.0000 mg | DELAYED_RELEASE_TABLET | Freq: Every day | ORAL | Status: DC
  Administered 2017-04-17: 40 mg via ORAL
  Filled 2017-04-17: qty 1

## 2017-04-17 MED ORDER — ADULT MULTIVITAMIN W/MINERALS CH: 1.0000 | ORAL_TABLET | Freq: Every day | ORAL | Status: DC

## 2017-04-17 MED ORDER — FOLIC ACID 1 MG PO TABS: 1.0000 mg | ORAL_TABLET | Freq: Every day | ORAL | 0 refills | Status: DC

## 2017-04-17 NOTE — Progress Notes (Signed)
Nutrition Brief Note  Patient identified on the Malnutrition Screening Tool (MST) Report  Wt Readings from Last 15 Encounters:  04/16/17 169 lb 1.5 oz (76.7 kg)  08/11/15 185 lb (83.9 kg)   Pt reports no recent weight loss; reports weight does fluctuate up and down. Noted per weight encounters, 8.6% wt loss in  >1 year which is not significant for time frame.  Body mass index is 22.31 kg/m.   Current diet order is Soft, no recorded po intake. Pt reports good appetite currently and PTA. Pt reports eating breakfast this AM, starting to eat lunch on visit today. Labs and medications reviewed.   No nutrition interventions warranted at this time. If nutrition issues arise, please consult RD.   Kerman Passey MS, RD, [place], [place] [phone] Pager  [phone] Weekend/On-Call Pager

## 2017-04-17 NOTE — Discharge Summary (Signed)
Physician Discharge Summary  KEYMANI MCLEAN MCN:[id number] DOB: 01-03-1961  PCP: Lujean Amel, MD  Admit date: 04/16/2017 Discharge date: 04/17/2017  Recommendations for Outpatient Follow-up:  1. Dr. Lujean Amel, PCP in 3 days with repeat labs (CBC & BMP).  Home Health: None Equipment/Devices: None    Discharge Condition: Improved and stable  CODE STATUS: Full  Diet recommendation: Heart healthy diet.  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 History was obtained from patient and his spouse and daughter at bedside. Patient was in his usual state of health until 4 days PTA. He ate a fish sandwich with tar tar sauce at noontime on Wednesday prior to admission. Approximately 4 hours later, he started feeling abdominal discomfort. Over the course of that night, he developed severe diffuse cramping abdominal pain associated with severe chills but no nausea, vomiting, fever. He was unable to sleep that night and did not eat anything. The next day he developed severe and multiple episodes of diarrhea which was watery, yellowish discoloration without mucus or blood, multiple episodes over the next 24 hours. Simultaneously his abdominal pain was gradually decreasing. His diarrhea then subsided but he started having nonbloody emesis every couple of hours. He tried to stay hydrated by drinking electrolyte drinks. 2 nights ago he felt somewhat better and  was able to sleep through the course of the night without vomiting or diarrhea. Since day prior to admission when he tried to eat or drink anything other than water, he would develop nausea and vomiting and unable to keep anything down. He had been drinking only water and tea with sugar. He had been taking his prescription antihypertensives. His last episode of diarrhea was on morning and vomiting earlier in the afternoon of admission. He felt hungry in the ED. He felt weak, at times dizzy and lightheaded but no passing out. His urine output had progressively decreased over the last couple of days. His wife and daughter insisted on him coming to the ED for evaluation today prior to admission but patient declined. Since he was unable to keep anything down, he finally came to the ED.  ED Course: Noted to be hypotensive and was resuscitated with 2 L of normal saline and blood pressures improved from SBP of 80s to 113/50 mmHg at bedside. Lab work significant for sodium of 126 and creatinine greater than 6. He was admitted for evaluation and management of acute renal failure in the context of dehydration from acute presumed viral gastroenteritis versus food poisoning.  Assessment and plan:  1. Acute renal failure, possibly oliguric: Precipitated by dehydration from GI losses, possible ATN from hypotension and ARB. Presented with creatinine of 6.7 (creatinine was 0.9 in early October 2018 as seen in patients my chart). Held ARB. FeNA: 0.5% suggesting prerenal etiology. He was hydrated with IV normal saline while BMP was closely monitored. His creatinine gradually improved to 1.69. Encouraged patient to drink plenty of fluids as outpatient, continue  to hold ARB until outpatient follow-up with repeat labs with PCP in the next couple of days. Expect complete renal recovery. Patient urinating well. 2. Anion gap metabolic acidosis: Anion gap 23 on admission. Lactate normal. Likely related to acute renal failure.  Management as above. Resolved. 3. Acute gastroenteritis versus food poisoning: No history of antibiotic exposure. Started with clear liquid diet and advanced as tolerated. Diarrhea and vomiting have resolved. Patient is tolerating regular consistency diet. 4. Dehydration with hyponatremia: Patient and family report chronic hyponatremia but last sodium in October 2018 was 135. Hydrated with IV normal saline. Dehydration is clinically resolved. Sodium has increased from 126 on admission to 131 and is stable. 5. Hypotension/essential hypertension: Secondary to dehydration and antihypertensives. Hold ARB until outpatient follow-up with PCP. Hypotension resolved and blood pressure is currently controlled. 6. Hyperlipidemia: Continue statins. 7. Alcohol use: CIWA protocol. No overt withdrawal symptoms noted. Patient feels that this is a good time to quit smoking and drinking. Encouraged him regarding same. 8. Tobacco abuse: Cessation counseled. He agrees to nicotine patch and wishes to have it at discharge. 9. COPD: Stable without clinical bronchospasm. 10. Mild thrombocytopenia: May be due to acute illness. Follow CBCs in a few days as outpatient. 11. Hypokalemia: Replaced prior to discharge. Follow BMP in a few days as outpatient.    Consultations:  None  Procedures:  None   Discharge Instructions  Discharge Instructions    Call MD for:   Complete by:  As directed    Diarrhea.   Call MD for:  extreme fatigue   Complete by:  As directed    Call MD for:  persistant dizziness or light-headedness   Complete by:  As directed    Call MD for:  persistant nausea and vomiting   Complete by:  As directed    Call MD for:  severe uncontrolled pain   Complete by:  As directed    Call MD for:  temperature >100.4   Complete by:  As directed    Diet - low sodium heart healthy   Complete by:  As directed    Increase activity slowly   Complete by:  As directed        Medication List    STOP  taking these medications   irbesartan 75 MG tablet Commonly known as:  AVAPRO     TAKE these medications   atorvastatin 10 MG tablet Commonly known as:  LIPITOR Take 10 mg by mouth daily.   fluticasone 50 MCG/ACT nasal spray Commonly known as:  FLONASE Place 2 sprays into both nostrils daily as needed for rhinitis.   folic acid 1 MG tablet Commonly known as:  FOLVITE Take 1 tablet (1 mg total) by mouth daily. Start taking on:  04/18/2017   multivitamin with minerals Tabs tablet Take 1 tablet by mouth daily. Start taking on:  04/18/2017   nicotine 21 mg/24hr patch Commonly known as:  NICODERM CQ - dosed in mg/24 hours Place 1 patch (21 mg total) onto the skin daily. Start taking on:  04/18/2017   pantoprazole 40 MG tablet Commonly known as:  PROTONIX Take 1 tablet (40 mg total) by mouth daily. Start taking on:  04/18/2017   thiamine 100 MG tablet Take 1 tablet (100 mg total) by mouth daily. Start taking on:  04/18/2017      Follow-up Information    Koirala, Dibas, MD. Schedule an appointment as soon as possible for a visit in 3 day(s).   Specialty:  Family Medicine  Why:  To be seen with repeat labs (CBC & BMP). Contact information: [address] [place] [address] [phone]          No Known Allergies    Procedures/Studies: No results found.    Subjective: Feels much better. Had a BM at 3 this morning which had more formed stools. No abdominal pain. Tolerated soft diet without nausea or vomiting. No dizziness or lightheadedness reported. No fever or chills.  Discharge Exam:  Vitals:   04/17/17 0000 04/17/17 0400 04/17/17 0638 04/17/17 0821  BP: 118/70 109/68 105/69 133/70  Pulse: 92 94 90 94  Resp:   18 18  Temp:   98.3 F (36.8 C) 97.8 F (36.6 C)  TempSrc:   Oral Oral  SpO2:   100% 97%  Weight:      Height:        General: Pt lying comfortably in bed & appears in no obvious distress. Oral mucosa  moist. Cardiovascular: S1 & S2 heard, RRR, S1/S2 +. No murmurs, rubs, gallops or clicks. No JVD or pedal edema. Respiratory: Clear to auscultation without wheezing, rhonchi or crackles. No increased work of breathing. Abdominal:  Non distended, non tender & soft. No organomegaly or masses appreciated. Normal bowel sounds heard. CNS: Alert and oriented. No focal deficits. Extremities: no edema, no cyanosis    The results of significant diagnostics from this hospitalization (including imaging, microbiology, ancillary and laboratory) are listed below for reference.       Labs: CBC: Recent Labs  Lab 04/16/17 1520 04/17/17 0738  WBC 10.2 7.9  HGB 14.9 13.1  HCT 40.5 35.9*  MCV 96.4 94.5  PLT 203 852*   Basic Metabolic Panel: Recent Labs  Lab 04/16/17 1520 04/16/17 1953 04/17/17 0152 04/17/17 0738 04/17/17 1220  NA 126* 129* 129* 131* 131*  K 3.6 3.6 3.3* 3.1* 3.3*  CL 86* 93* 94* 99* 96*  CO2 17* 18* 23 21* 23  GLUCOSE 124* 110* 99 97 104*  BUN 74* 73* 67* 57* 51*  CREATININE 6.71* 5.22* 3.48* 2.30* 1.69*  CALCIUM 8.9 8.3* 8.3* 8.3* 8.5*   Liver Function Tests: Recent Labs  Lab 04/16/17 1520  AST 26  ALT 23  ALKPHOS 70  BILITOT 0.7  PROT 8.2*  ALBUMIN 3.7   Discussed in detail with patient's spouse at bedside. Updated care and answered questions.   Time coordinating discharge: Over 30 minutes  SIGNED:  Vernell Leep, MD, FACP, [facility]. [facility] Pager [phone]  If 7PM-7AM, please contact night-coverage [email or web address] Password [email or web address] 04/17/2017, 2:54 PM

## 2017-04-17 NOTE — Progress Notes (Signed)
Pt. discharged to home. Pt after visit summary reviewed and pt capable of re verbalizing medications and follow up appointments. Pt remains stable. No signs and symptoms of distress. Educated to return to ER in the event of SOB, dizziness, chest pain, or fainting. Derward Marple, RN  

## 2017-04-17 NOTE — Discharge Instructions (Signed)

## 2018-01-08 ENCOUNTER — Other Ambulatory Visit: Payer: Self-pay | Admitting: Acute Care

## 2018-01-08 DIAGNOSIS — Z122 Encounter for screening for malignant neoplasm of respiratory organs: Secondary | ICD-10-CM

## 2018-01-08 DIAGNOSIS — F17210 Nicotine dependence, cigarettes, uncomplicated: Secondary | ICD-10-CM

## 2018-01-22 ENCOUNTER — Ambulatory Visit
Admission: RE | Admit: 2018-01-22 | Discharge: 2018-01-22 | Disposition: A | Discharge status: Home / Self Care | Payer: BLUE CROSS/BLUE SHIELD | Source: Ambulatory Visit | Attending: Acute Care | Admitting: Acute Care

## 2018-01-22 DIAGNOSIS — F17210 Nicotine dependence, cigarettes, uncomplicated: Secondary | ICD-10-CM

## 2018-01-22 DIAGNOSIS — Z122 Encounter for screening for malignant neoplasm of respiratory organs: Secondary | ICD-10-CM

## 2018-01-26 ENCOUNTER — Other Ambulatory Visit: Payer: Self-pay | Admitting: *Deleted

## 2018-01-26 DIAGNOSIS — Z122 Encounter for screening for malignant neoplasm of respiratory organs: Secondary | ICD-10-CM

## 2018-01-26 DIAGNOSIS — F17210 Nicotine dependence, cigarettes, uncomplicated: Secondary | ICD-10-CM

## 2018-10-19 NOTE — H&P (Signed)
Otolaryngology Clinic Note  HPI:    Randy Davidson is a 58 y.o. male patient of Patient Does Not Have Pcp for evaluation of hoarseness.  He has typical spring allergies.  He uses [employer] account planner.  He has morning reflux.  He has had hoarseness in the past but never this prolonged.  He has now been hoarse 3+ months.  It is not clearly getting worse.  Voice quality does not seem to fluctuate.  He is breathing and swallowing okay.  No history of hypothyroidism.  He has occasional left-sided eustachian tube dysfunction.  He is a 1-1/2 pack/day x 40 years smoker.  He coughs up some clear/white material, but no colorful or bloody sputum.  PMH/Meds/All/SocHx/FamHx/ROS:   Past Medical History:  Diagnosis Date  . High cholesterol     Past Surgical History:  Procedure Laterality Date  . cyst removed     from face    No family history of bleeding disorders, wound healing problems or difficulty with anesthesia.   Social History   Socioeconomic History  . Marital status: Married    Spouse name: Not on file  . Number of children: Not on file  . Years of education: Not on file  . Highest education level: Not on file  Occupational History  . Not on file  Social Needs  . Financial resource strain: Not on file  . Food insecurity    Worry: Not on file    Inability: Not on file  . Transportation needs    Medical: Not on file    Non-medical: Not on file  Tobacco Use  . Smoking status: Current Every Day Smoker    Types: Cigarettes  . Smokeless tobacco: Never Used  Substance and Sexual Activity  . Alcohol use: Yes    Frequency: Monthly or less    Drinks per session: 1 or 2    Binge frequency: Less than monthly  . Drug use: Not on file  . Sexual activity: Not on file  Lifestyle  . Physical activity    Days per week: Not on file    Minutes per session: Not on file  . Stress: Not on file  Relationships  . Social Herbalist on phone: Not on file    Gets together: Not on  file    Attends religious service: Not on file    Active member of club or organization: Not on file    Attends meetings of clubs or organizations: Not on file    Relationship status: Not on file  Other Topics Concern  . Not on file  Social History Narrative  . Not on file     Current Outpatient Medications:  .  atorvastatin (LIPITOR) 20 MG tablet,  , Disp: , Rfl:   A complete ROS was performed with pertinent positives/negatives noted in the HPI. The remainder of the ROS are negative.    Physical Exam:    Ht 1.854 m (6\' 1" )   Wt 80.3 kg (177 lb)   BMI 23.35 kg/m  He is trim and healthy.  Mental status is appropriate.  He hears well in conversational speech.  Voice is raspy but not diplophonic and respirations unlabored through the nose.  The head is atraumatic and neck supple.  Cranial nerves intact.  Ear canals are clear with normal drums.  Anterior nose is moist and patent.  Oral cavity reveals full upper and lower plates.  Oropharynx is clear.  I could not get an adequate mirror  examination of the hypopharynx or larynx.  Neck without adenopathy.  Using the flexible laryngoscope, there is a 4-5 mm diameter spherical lesion on the mid right vocal cord with a superficial eschar and a broad pedicle.  Vocal cords are fully mobile.  Airway is good.  No pooling in valleculae or piriforms.  Lungs: Clear to auscultation Heart: Regular rate and rhythm without murmurs Abdomen: Soft, active Extremities: Normal configuration Neurologic: Symmetric, grossly intact.    Flexible Laryngoscopy   Indications were discussed.  Details of the procedure were explained.  Questions were answered and informed consent was obtained verbally.    Technique:  After anesthetizing the nasal cavity with topical lidocaine and oxymetazoline, the flexible endoscope was introduced and passed through the right nasal cavity into the nasopharynx. The scope was withdrawn from the nose. He tolerated the procedure  well.     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  Plan: He gets a screening chest x-ray every year.  It has been at least 6 months since the last one.  I will recheck this.  I would like to do a panendoscopy with microlaryngoscopy, biopsy with frozen section, and possible vocal cord stripping versus laser ablation.  We discussed the surgery in detail including risks and complications.  Questions were answered and informed consent was obtained.  I sent in a prescription for Lortab liquid to [facility].   Lilyan Gilford, MD  [id number]/[id number]

## 2018-10-20 ENCOUNTER — Other Ambulatory Visit (HOSPITAL_COMMUNITY)
Admission: RE | Admit: 2018-10-20 | Discharge: 2018-10-20 | Disposition: A | Discharge status: Home / Self Care | Payer: BC Managed Care – PPO | Source: Ambulatory Visit | Attending: Otolaryngology | Admitting: Otolaryngology

## 2018-10-20 DIAGNOSIS — Z1159 Encounter for screening for other viral diseases: Secondary | ICD-10-CM | POA: Diagnosis present

## 2018-10-20 LAB — SARS CORONAVIRUS 2 (TAT 6-24 HRS): SARS Coronavirus 2: NEGATIVE

## 2018-10-23 ENCOUNTER — Other Ambulatory Visit: Payer: Self-pay

## 2018-10-23 ENCOUNTER — Encounter (HOSPITAL_COMMUNITY): Payer: Self-pay | Admitting: *Deleted

## 2018-10-23 NOTE — Anesthesia Preprocedure Evaluation (Addendum)
Anesthesia Evaluation  Patient identified by MRN, date of birth, ID band Patient awake    Reviewed: Allergy & Precautions, NPO status , Patient's Chart, lab work & pertinent test results  Airway Mallampati: I  TM Distance: >3 FB Neck ROM: Full    Dental  (+) Edentulous Upper, Edentulous Lower   Pulmonary COPD, Current Smoker,    Pulmonary exam normal breath sounds clear to auscultation       Cardiovascular hypertension, + CAD  Normal cardiovascular exam Rhythm:Regular Rate:Normal  EKG RBBB   Neuro/Psych negative neurological ROS  negative psych ROS   GI/Hepatic GERD  Medicated,(+)     substance abuse  alcohol use,   Endo/Other  negative endocrine ROS  Renal/GU negative Renal ROS  negative genitourinary   Musculoskeletal negative musculoskeletal ROS (+)   Abdominal   Peds  Hematology negative hematology ROS (+)   Anesthesia Other Findings   Reproductive/Obstetrics                            Anesthesia Physical Anesthesia Plan  ASA: III  Anesthesia Plan: General   Post-op Pain Management:    Induction: Intravenous  PONV Risk Score and Plan: 1 and Ondansetron, Dexamethasone and Midazolam  Airway Management Planned:   Additional Equipment:   Intra-op Plan:   Post-operative Plan:   Informed Consent: I have reviewed the patients History and Physical, chart, labs and discussed the procedure including the risks, benefits and alternatives for the proposed anesthesia with the patient or authorized representative who has indicated his/her understanding and acceptance.     Dental advisory given  Plan Discussed with: CRNA  Anesthesia Plan Comments:         Anesthesia Quick Evaluation

## 2018-10-24 ENCOUNTER — Other Ambulatory Visit: Payer: Self-pay

## 2018-10-24 ENCOUNTER — Ambulatory Visit (HOSPITAL_COMMUNITY)
Admission: RE | Admit: 2018-10-24 | Discharge: 2018-10-24 | Disposition: A | Discharge status: Home / Self Care | Payer: BC Managed Care – PPO | Attending: Otolaryngology | Admitting: Otolaryngology

## 2018-10-24 ENCOUNTER — Encounter (HOSPITAL_COMMUNITY): Payer: Self-pay | Admitting: *Deleted

## 2018-10-24 ENCOUNTER — Ambulatory Visit (HOSPITAL_COMMUNITY): Payer: BC Managed Care – PPO | Admitting: Anesthesiology

## 2018-10-24 ENCOUNTER — Encounter (HOSPITAL_COMMUNITY)
Admission: RE | Disposition: A | Discharge status: Home / Self Care | Payer: Self-pay | Source: Home / Self Care | Attending: Otolaryngology

## 2018-10-24 DIAGNOSIS — F17210 Nicotine dependence, cigarettes, uncomplicated: Secondary | ICD-10-CM | POA: Insufficient documentation

## 2018-10-24 DIAGNOSIS — J449 Chronic obstructive pulmonary disease, unspecified: Secondary | ICD-10-CM | POA: Insufficient documentation

## 2018-10-24 DIAGNOSIS — R490 Dysphonia: Secondary | ICD-10-CM | POA: Diagnosis present

## 2018-10-24 DIAGNOSIS — C320 Malignant neoplasm of glottis: Secondary | ICD-10-CM | POA: Diagnosis not present

## 2018-10-24 DIAGNOSIS — K219 Gastro-esophageal reflux disease without esophagitis: Secondary | ICD-10-CM | POA: Insufficient documentation

## 2018-10-24 DIAGNOSIS — I2510 Atherosclerotic heart disease of native coronary artery without angina pectoris: Secondary | ICD-10-CM | POA: Diagnosis not present

## 2018-10-24 DIAGNOSIS — I10 Essential (primary) hypertension: Secondary | ICD-10-CM | POA: Insufficient documentation

## 2018-10-24 HISTORY — PX: RIGID BRONCHOSCOPY: SHX5069

## 2018-10-24 HISTORY — PX: PANENDOSCOPY: SHX2159

## 2018-10-24 HISTORY — PX: ESOPHAGOSCOPY: SHX5534

## 2018-10-24 HISTORY — PX: DIRECT LARYNGOSCOPY: SHX5326

## 2018-10-24 LAB — CBC
HCT: 40.2 % (ref 39.0–52.0)
Hemoglobin: 14.2 g/dL (ref 13.0–17.0)
MCH: 35.8 pg — ABNORMAL HIGH (ref 26.0–34.0)
MCHC: 35.3 g/dL (ref 30.0–36.0)
MCV: 101.3 fL — ABNORMAL HIGH (ref 80.0–100.0)
Platelets: 167 10*3/uL (ref 150–400)
RBC: 3.97 MIL/uL — ABNORMAL LOW (ref 4.22–5.81)
RDW: 11.9 % (ref 11.5–15.5)
WBC: 7.6 10*3/uL (ref 4.0–10.5)
nRBC: 0 % (ref 0.0–0.2)

## 2018-10-24 LAB — BASIC METABOLIC PANEL
Anion gap: 11 (ref 5–15)
BUN: 8 mg/dL (ref 6–20)
CO2: 21 mmol/L — ABNORMAL LOW (ref 22–32)
Calcium: 9 mg/dL (ref 8.9–10.3)
Chloride: 105 mmol/L (ref 98–111)
Creatinine, Ser: 1.06 mg/dL (ref 0.61–1.24)
GFR calc Af Amer: >60 mL/min (ref >60)
GFR calc non Af Amer: >60 mL/min (ref >60)
Glucose, Bld: 102 mg/dL — ABNORMAL HIGH (ref 70–99)
Potassium: 3.9 mmol/L (ref 3.5–5.1)
Sodium: 137 mmol/L (ref 135–145)

## 2018-10-24 SURGERY — LARYNGOSCOPY, WITH BRONCHOSCOPY AND ESOPHAGOSCOPY
Anesthesia: General | Site: Mouth | Laterality: Right

## 2018-10-24 MED ORDER — MIDAZOLAM HCL 2 MG/2ML IJ SOLN
INTRAMUSCULAR | Status: AC
  Filled 2018-10-24: qty 2

## 2018-10-24 MED ORDER — ROCURONIUM BROMIDE 10 MG/ML (PF) SYRINGE
PREFILLED_SYRINGE | INTRAVENOUS | Status: DC | PRN
  Administered 2018-10-24: 60 mg via INTRAVENOUS

## 2018-10-24 MED ORDER — MIDAZOLAM HCL 5 MG/5ML IJ SOLN
INTRAMUSCULAR | Status: DC | PRN
  Administered 2018-10-24: 2 mg via INTRAVENOUS

## 2018-10-24 MED ORDER — LIDOCAINE 2% (20 MG/ML) 5 ML SYRINGE
INTRAMUSCULAR | Status: DC | PRN
  Administered 2018-10-24: 80 mg via INTRAVENOUS

## 2018-10-24 MED ORDER — ARTIFICIAL TEARS OPHTHALMIC OINT
TOPICAL_OINTMENT | OPHTHALMIC | Status: DC | PRN
  Administered 2018-10-24: 1 via OPHTHALMIC

## 2018-10-24 MED ORDER — COCAINE HCL 4 % EX SOLN
CUTANEOUS | Status: AC
  Filled 2018-10-24: qty 4

## 2018-10-24 MED ORDER — FENTANYL CITRATE (PF) 250 MCG/5ML IJ SOLN
INTRAMUSCULAR | Status: AC
  Filled 2018-10-24: qty 5

## 2018-10-24 MED ORDER — DEXAMETHASONE SODIUM PHOSPHATE 10 MG/ML IJ SOLN
INTRAMUSCULAR | Status: DC | PRN
  Administered 2018-10-24: 10 mg via INTRAVENOUS

## 2018-10-24 MED ORDER — ACETAMINOPHEN 500 MG PO TABS
1000.0000 mg | ORAL_TABLET | Freq: Once | ORAL | Status: AC
  Administered 2018-10-24: 1000 mg via ORAL
  Filled 2018-10-24: qty 2

## 2018-10-24 MED ORDER — PROPOFOL 10 MG/ML IV BOLUS
INTRAVENOUS | Status: AC
  Filled 2018-10-24: qty 20

## 2018-10-24 MED ORDER — ALBUTEROL SULFATE HFA 108 (90 BASE) MCG/ACT IN AERS
INHALATION_SPRAY | RESPIRATORY_TRACT | Status: DC | PRN
  Administered 2018-10-24: 6 via RESPIRATORY_TRACT

## 2018-10-24 MED ORDER — TRIAMCINOLONE ACETONIDE 40 MG/ML IJ SUSP
INTRAMUSCULAR | Status: AC
  Filled 2018-10-24: qty 5

## 2018-10-24 MED ORDER — ARTIFICIAL TEARS OPHTHALMIC OINT
TOPICAL_OINTMENT | OPHTHALMIC | Status: AC
  Filled 2018-10-24: qty 3.5

## 2018-10-24 MED ORDER — EPINEPHRINE HCL (NASAL) 0.1 % NA SOLN
NASAL | Status: AC
  Filled 2018-10-24: qty 30

## 2018-10-24 MED ORDER — SUGAMMADEX SODIUM 200 MG/2ML IV SOLN
INTRAVENOUS | Status: DC | PRN
  Administered 2018-10-24: 200 mg via INTRAVENOUS

## 2018-10-24 MED ORDER — HYDROCODONE-ACETAMINOPHEN 7.5-325 MG/15ML PO SOLN: 10.0000 mL | ORAL | Status: DC | PRN

## 2018-10-24 MED ORDER — ONDANSETRON HCL 4 MG/2ML IJ SOLN
INTRAMUSCULAR | Status: DC | PRN
  Administered 2018-10-24: 4 mg via INTRAVENOUS

## 2018-10-24 MED ORDER — SODIUM CHLORIDE 0.9 % IV SOLN
INTRAVENOUS | Status: DC | PRN
  Administered 2018-10-24: 15 ug/min via INTRAVENOUS

## 2018-10-24 MED ORDER — FENTANYL CITRATE (PF) 250 MCG/5ML IJ SOLN
INTRAMUSCULAR | Status: DC | PRN
  Administered 2018-10-24: 50 ug via INTRAVENOUS
  Administered 2018-10-24: 25 ug via INTRAVENOUS
  Administered 2018-10-24: 75 ug via INTRAVENOUS

## 2018-10-24 MED ORDER — LACTATED RINGERS IV SOLN
INTRAVENOUS | Status: DC | PRN
  Administered 2018-10-24 (×2): via INTRAVENOUS

## 2018-10-24 MED ORDER — COCAINE HCL 4 % EX SOLN
CUTANEOUS | Status: DC | PRN
  Administered 2018-10-24: 4 mL via TOPICAL

## 2018-10-24 MED ORDER — 0.9 % SODIUM CHLORIDE (POUR BTL) OPTIME
TOPICAL | Status: DC | PRN
  Administered 2018-10-24: 1000 mL

## 2018-10-24 MED ORDER — EPINEPHRINE HCL (NASAL) 0.1 % NA SOLN: NASAL | Status: DC | PRN

## 2018-10-24 MED ORDER — PROPOFOL 10 MG/ML IV BOLUS
INTRAVENOUS | Status: DC | PRN
  Administered 2018-10-24: 150 mg via INTRAVENOUS

## 2018-10-24 SURGICAL SUPPLY — 34 items
BALLN PULM 15 16.5 18X75 (BALLOONS)
BALLOON PULM 15 16.5 18X75 (BALLOONS) IMPLANT
BLADE SURG 15 STRL LF DISP TIS (BLADE) IMPLANT
BLADE SURG 15 STRL SS (BLADE)
CONT SPEC 4OZ CLIKSEAL STRL BL (MISCELLANEOUS) ×6 IMPLANT
COVER BACK TABLE 60X90IN (DRAPES) ×5 IMPLANT
COVER MAYO STAND STRL (DRAPES) ×5 IMPLANT
COVER WAND RF STERILE (DRAPES) ×5 IMPLANT
CRADLE DONUT ADULT HEAD (MISCELLANEOUS) IMPLANT
DRAPE HALF SHEET 40X57 (DRAPES) ×5 IMPLANT
GAUZE 4X4 16PLY RFD (DISPOSABLE) IMPLANT
GAUZE SPONGE 4X4 12PLY STRL (GAUZE/BANDAGES/DRESSINGS) ×5 IMPLANT
GLOVE ECLIPSE 8.0 STRL XLNG CF (GLOVE) ×5 IMPLANT
GOWN BRE IMP SLV AUR LG STRL (GOWN DISPOSABLE) IMPLANT
GUARD TEETH (MISCELLANEOUS) ×5 IMPLANT
KIT BASIN OR (CUSTOM PROCEDURE TRAY) ×5 IMPLANT
KIT TURNOVER KIT B (KITS) ×5 IMPLANT
MARKER SKIN DUAL TIP RULER LAB (MISCELLANEOUS) IMPLANT
NDL HYPO 25GX1X1/2 BEV (NEEDLE) IMPLANT
NEEDLE HYPO 25GX1X1/2 BEV (NEEDLE) IMPLANT
NS IRRIG 1000ML POUR BTL (IV SOLUTION) ×5 IMPLANT
PAD ARMBOARD 7.5X6 YLW CONV (MISCELLANEOUS) ×10 IMPLANT
PAD EYE OVAL STERILE LF (GAUZE/BANDAGES/DRESSINGS) IMPLANT
PATTIES SURGICAL .5 X3 (DISPOSABLE) IMPLANT
PATTIES SURGICAL 1X1 (DISPOSABLE) IMPLANT
SOLUTION ANTI FOG 6CC (MISCELLANEOUS) IMPLANT
SPECIMEN JAR SMALL (MISCELLANEOUS) ×5 IMPLANT
SPONGE INTESTINAL PEANUT (DISPOSABLE) IMPLANT
SURGILUBE 2OZ TUBE FLIPTOP (MISCELLANEOUS) ×5 IMPLANT
SYR INFLATE BILIARY GAUGE (MISCELLANEOUS) IMPLANT
TOWEL GREEN STERILE FF (TOWEL DISPOSABLE) ×10 IMPLANT
TRAP SPECIMEN MUCOUS 40CC (MISCELLANEOUS) IMPLANT
TUBE CONNECTING 12X1/4 (SUCTIONS) ×5 IMPLANT
WATER STERILE IRR 1000ML POUR (IV SOLUTION) ×5 IMPLANT

## 2018-10-24 NOTE — Discharge Instructions (Signed)
Advance diet as comfortable OK for activities including bathing tomorrow Should be OK to return to work Monday, 29 JUN Gentle voice use Call for bleeding, any breathing issues  Recheck my office 2 weeks, [phone] for an appointment

## 2018-10-24 NOTE — Interval H&P Note (Signed)
History and Physical Interval Note:  10/24/2018 7:28 AM  Randy Davidson  has presented today for surgery, with the diagnosis of Vocal Cord Lesion.  The various methods of treatment have been discussed with the patient and family. After consideration of risks, benefits and other options for treatment, the patient has consented to  Procedure(s): PANENDOSCOPY (N/A) Micro DIRECT LARYNGOSCOPY with Laser, biopy and frozen section (Left) as a surgical intervention.  The patient's history has been re-reviewed, patient re-examined, no change in status, stable for surgery.  I have re-reviewed the patient's chart and labs.  Questions were answered to the patient's satisfaction.     Ileene Hutchinson [facility]

## 2018-10-24 NOTE — Op Note (Signed)
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  Cervical esophagoscopy.  Rigid bronchoscopy.  Surg:  Jodi Marble T MD  Anes:  GOT  EBL: Minimal  Comp: None  Findings: An exophytic 4-5 mm lesion on the posterior midportion of the right vocal cord readily stripped free from the vocal ligament.  Possible small right level 3 adenopathy.   Procedure: With the patient in a comfortable supine position, GOT anesthesia was induced without difficulty.  At an appropriate level, the table was turned 90 degrees away from Anesthesia.  A clean preparation and draping was performed in the standard fashion.  A surgical time out was obtained in the standard fashion.  A moist 4 x 4 was used to protect the upper gum..     Using the [facility] laryngoscope, the larynx was visualized.  The rod bronchoscope was passed by the ETT cuff and inspection down into the mainstem bronchus on each side was performed with the findings as described above.  Photographs were taken of the lesion.  The bronchoscope and laryngoscope were removed.  Under direct vision, a cup forceps biopsy of the vocal cord lesion was obtained and sent for frozen section.  The anterior commissure laryngoscope was introduced taking care to protect lips, teeth, and endotracheal tube.  Complete laryngoscopy was performed in the standard fashion.  The findings were as described above.  The laryngoscope was removed.  The cervical esophagoscope was lubricated and inserted into the hypopharynx.  With gentle pressure, it was passed through the cricopharyngeus and advanced to its full length without difficulty with the findings as described above.  It was removed.  The oropharynx, oral cavity, nasopharynx and hypopharynx were palpated with findings as described above.  The neck was  palpated on both sides with the findings as described above.  Upon receiving frozen section diagnosis of squamous cell carcinoma, the anterior commissure laryngoscope was reintroduced and suspended.  Microscope was brought into the field in the standard fashion.  Cocaine pledgets were used to achieve anesthesia along the vocal cords and a saline pledget was used to protect the cuff.  delimiting incisions were sharply made in front of the visible lesion and behind.  A mucosal incision lateral to the lesion was made with a sickle knife along the superior surface of the vocal cord.  Semi-sharp dissection freed the mucosa and the tumor from the underlying vocalis ligament and muscle.  The lesion was delivered piecemeal.  Several additional small fragments of mucosa were cleaned up and sent along with the specimen.  Hemostasis was spontaneous.  Photographs were taken once again.  At this point the procedure was completed. The tooth guard was removed.   Dental status was intact.  The patient was returned to Anesthesia, awakened, extubated, and transferred to PACU in satisfactory condition.   Dispo:   PACU to home  Plan: Ice, analgesics, cough suppressant as necessary.  I will check a chest x-ray, and a CT scan with contrast of the neck.  Recheck my office 2 weeks.  Tyson Alias MD

## 2018-10-24 NOTE — Anesthesia Postprocedure Evaluation (Signed)
Anesthesia Post Note  Patient: Randy Davidson  Procedure(s) Performed: PANENDOSCOPY (N/A ) DIRECT LARYNGOSCOPY with biopsies and frozen section (Right Mouth) Rigid Bronchoscopy (N/A Bronchus) Esophagoscopy (N/A Esophagus)     Patient location during evaluation: PACU Anesthesia Type: General Level of consciousness: awake and alert Pain management: pain level controlled Vital Signs Assessment: post-procedure vital signs reviewed and stable Respiratory status: spontaneous breathing, nonlabored ventilation, respiratory function stable and patient connected to nasal cannula oxygen Cardiovascular status: blood pressure returned to baseline and stable Postop Assessment: no apparent nausea or vomiting Anesthetic complications: no    Last Vitals:  Vitals:   10/24/18 0939 10/24/18 0954  BP: 133/87 (!) 138/93  Pulse: 73 67  Resp: 14 14  Temp:  36.6 C  SpO2: 94% 97%    Last Pain:  Vitals:   10/24/18 0954  PainSc: 0-No pain                 Randy Davidson Randy Davidson

## 2018-10-24 NOTE — Transfer of Care (Signed)
Immediate Anesthesia Transfer of Care Note  Patient: Randy Davidson  Procedure(s) Performed: PANENDOSCOPY (N/A ) DIRECT LARYNGOSCOPY with biopsies and frozen section (Right Mouth) Rigid Bronchoscopy (N/A Bronchus) Esophagoscopy (N/A Esophagus)  Patient Location: PACU  Anesthesia Type:General  Level of Consciousness: awake, alert  and oriented  Airway & Oxygen Therapy: Patient Spontanous Breathing and Patient connected to face mask oxygen  Post-op Assessment: Report given to RN and Post -op Vital signs reviewed and stable  Post vital signs: Reviewed and stable  Last Vitals:  Vitals Value Taken Time  BP 135/83   Temp    Pulse 78 10/24/18 0923  Resp 11 10/24/18 0923  SpO2 100 % 10/24/18 0923  Vitals shown include unvalidated device data.  Last Pain:  Vitals:   10/24/18 0617  PainSc: 0-No pain      Patients Stated Pain Goal: 3 (10/62/69 4854)  Complications: No apparent anesthesia complications

## 2018-10-24 NOTE — Anesthesia Procedure Notes (Signed)
Procedure Name: Intubation Date/Time: 10/24/2018 7:42 AM Performed by: Jearld Pies, CRNA Pre-anesthesia Checklist: Patient identified, Emergency Drugs available, Suction available and Patient being monitored Patient Re-evaluated:Patient Re-evaluated prior to induction Oxygen Delivery Method: Circle System Utilized Preoxygenation: Pre-oxygenation with 100% oxygen Induction Type: IV induction Ventilation: Oral airway inserted - appropriate to patient size and Mask ventilation without difficulty Laryngoscope Size: Miller and 3 Grade View: Grade I Tube type: Oral Tube size: 6.0 mm Number of attempts: 1 Airway Equipment and Method: Stylet and Oral airway Placement Confirmation: ETT inserted through vocal cords under direct vision,  positive ETCO2 and breath sounds checked- equal and bilateral Secured at: 24 cm Tube secured with: Tape Dental Injury: Teeth and Oropharynx as per pre-operative assessment

## 2018-10-25 ENCOUNTER — Encounter (HOSPITAL_COMMUNITY): Payer: Self-pay | Admitting: Otolaryngology

## 2018-10-29 ENCOUNTER — Other Ambulatory Visit: Payer: Self-pay | Admitting: Otolaryngology

## 2018-10-29 DIAGNOSIS — C320 Malignant neoplasm of glottis: Secondary | ICD-10-CM

## 2018-10-30 ENCOUNTER — Ambulatory Visit
Admission: RE | Admit: 2018-10-30 | Discharge: 2018-10-30 | Disposition: A | Discharge status: Home / Self Care | Payer: BC Managed Care – PPO | Source: Ambulatory Visit | Attending: Otolaryngology | Admitting: Otolaryngology

## 2018-10-30 DIAGNOSIS — C320 Malignant neoplasm of glottis: Secondary | ICD-10-CM

## 2018-10-30 MED ORDER — IOPAMIDOL (ISOVUE-300) INJECTION 61%
75.0000 mL | Freq: Once | INTRAVENOUS | Status: AC | PRN
  Administered 2018-10-30: 75 mL via INTRAVENOUS

## 2019-01-24 ENCOUNTER — Other Ambulatory Visit: Payer: Self-pay | Admitting: *Deleted

## 2019-01-24 DIAGNOSIS — Z122 Encounter for screening for malignant neoplasm of respiratory organs: Secondary | ICD-10-CM

## 2019-01-24 DIAGNOSIS — F17210 Nicotine dependence, cigarettes, uncomplicated: Secondary | ICD-10-CM

## 2019-09-26 ENCOUNTER — Telehealth: Payer: Self-pay | Admitting: Acute Care

## 2019-09-26 NOTE — Telephone Encounter (Signed)
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 Eric Form, NP advises for pt to have his PCP to monitor Chest Ct in the meantime.

## 2019-09-27 NOTE — Telephone Encounter (Signed)
Patient is returning phone call. Patient phone number is [phone].

## 2019-10-01 NOTE — Telephone Encounter (Signed)
Spoke with pt and advised that per Lung Cancer screening guidelines we will not be able to do CT screening until pt has been in remission from vocal chord cancer for 5 years. Pt advised to have PCP or ENT to do screening until we can pick him back up again. Pt verbalized understanding and states that he has appt with ENT this month and will discuss at that time. Nothing further needed at this time.

## 2020-02-15 ENCOUNTER — Ambulatory Visit: Payer: Self-pay | Attending: Internal Medicine

## 2020-02-15 DIAGNOSIS — Z23 Encounter for immunization: Secondary | ICD-10-CM

## 2020-02-15 NOTE — Progress Notes (Signed)
   Covid-19 Vaccination Clinic  Name:  Randy Davidson    MRN: [id number] DOB: [birth year]  02/15/2020  Mr. Randy Davidson was observed post Covid-19 immunization for 15 minutes without incident. He was provided with Vaccine Information Sheet and instruction to access the V-Safe system.   Randy Davidson was instructed to call 911 with any severe reactions post vaccine: Marland Kitchen Difficulty breathing  . Swelling of face and throat  . A fast heartbeat  . A bad rash all over body  . Dizziness and weakness

## 2020-04-29 ENCOUNTER — Other Ambulatory Visit: Payer: Self-pay | Admitting: Family Medicine

## 2020-04-29 DIAGNOSIS — F17210 Nicotine dependence, cigarettes, uncomplicated: Secondary | ICD-10-CM

## 2020-04-29 DIAGNOSIS — Z122 Encounter for screening for malignant neoplasm of respiratory organs: Secondary | ICD-10-CM

## 2020-04-29 DIAGNOSIS — F17200 Nicotine dependence, unspecified, uncomplicated: Secondary | ICD-10-CM

## 2020-05-20 ENCOUNTER — Ambulatory Visit
Admission: RE | Admit: 2020-05-20 | Discharge: 2020-05-20 | Disposition: A | Discharge status: Home / Self Care | Payer: PRIVATE HEALTH INSURANCE | Source: Ambulatory Visit | Attending: Family Medicine | Admitting: Family Medicine

## 2020-05-20 ENCOUNTER — Other Ambulatory Visit: Payer: Self-pay

## 2020-05-20 DIAGNOSIS — F17200 Nicotine dependence, unspecified, uncomplicated: Secondary | ICD-10-CM

## 2020-05-20 DIAGNOSIS — Z122 Encounter for screening for malignant neoplasm of respiratory organs: Secondary | ICD-10-CM

## 2020-05-20 DIAGNOSIS — F17210 Nicotine dependence, cigarettes, uncomplicated: Secondary | ICD-10-CM

## 2020-07-21 ENCOUNTER — Other Ambulatory Visit: Payer: Self-pay | Admitting: *Deleted

## 2020-07-21 DIAGNOSIS — F17210 Nicotine dependence, cigarettes, uncomplicated: Secondary | ICD-10-CM

## 2021-07-01 ENCOUNTER — Telehealth: Payer: Self-pay | Admitting: Acute Care

## 2021-07-01 NOTE — Telephone Encounter (Signed)
Left message for pt to call back to schedule f/u lung screening CT scan.  ?

## 2021-07-12 ENCOUNTER — Other Ambulatory Visit: Payer: Self-pay | Admitting: *Deleted

## 2021-07-12 DIAGNOSIS — F17210 Nicotine dependence, cigarettes, uncomplicated: Secondary | ICD-10-CM

## 2021-07-30 ENCOUNTER — Ambulatory Visit
Admission: RE | Admit: 2021-07-30 | Discharge: 2021-07-30 | Disposition: A | Discharge status: Home / Self Care | Payer: 59 | Source: Ambulatory Visit | Attending: Acute Care | Admitting: Acute Care

## 2021-07-30 DIAGNOSIS — F17210 Nicotine dependence, cigarettes, uncomplicated: Secondary | ICD-10-CM

## 2021-08-03 ENCOUNTER — Other Ambulatory Visit: Payer: Self-pay

## 2021-08-03 DIAGNOSIS — Z87891 Personal history of nicotine dependence: Secondary | ICD-10-CM

## 2021-08-03 DIAGNOSIS — Z122 Encounter for screening for malignant neoplasm of respiratory organs: Secondary | ICD-10-CM

## 2021-08-03 DIAGNOSIS — F17210 Nicotine dependence, cigarettes, uncomplicated: Secondary | ICD-10-CM

## 2022-08-02 ENCOUNTER — Ambulatory Visit
Admission: RE | Admit: 2022-08-02 | Discharge: 2022-08-02 | Disposition: A | Discharge status: Home / Self Care | Payer: 59 | Source: Ambulatory Visit | Attending: Family Medicine | Admitting: Family Medicine

## 2022-08-02 DIAGNOSIS — F17210 Nicotine dependence, cigarettes, uncomplicated: Secondary | ICD-10-CM

## 2022-08-02 DIAGNOSIS — Z122 Encounter for screening for malignant neoplasm of respiratory organs: Secondary | ICD-10-CM

## 2022-08-02 DIAGNOSIS — Z87891 Personal history of nicotine dependence: Secondary | ICD-10-CM

## 2022-09-21 ENCOUNTER — Telehealth: Payer: Self-pay | Admitting: Acute Care

## 2022-09-21 NOTE — Telephone Encounter (Signed)
I have attempted to call the patient with the results of their  Low Dose CT Chest Lung cancer screening scan. There was no answer. I have left a HIPPA compliant VM requesting the patient call the office for the scan results. I included the office contact information in the message. We will await his return call. If no return call we will continue to call until patient is contacted.   

## 2022-09-22 NOTE — Telephone Encounter (Signed)
Patient is returning phone call. Patient phone number is [phone]. °

## 2022-09-23 ENCOUNTER — Telehealth: Payer: Self-pay | Admitting: Acute Care

## 2022-09-23 DIAGNOSIS — F17210 Nicotine dependence, cigarettes, uncomplicated: Secondary | ICD-10-CM

## 2022-09-23 DIAGNOSIS — Z87891 Personal history of nicotine dependence: Secondary | ICD-10-CM

## 2022-09-23 DIAGNOSIS — Z122 Encounter for screening for malignant neoplasm of respiratory organs: Secondary | ICD-10-CM

## 2022-09-23 NOTE — Telephone Encounter (Signed)
New order placed for 3 month follow up scan. Results have been sent to PCP with note of plan with pt's agreement of plan. Pt verbalized understanding and denied any further questions or concerns at this time.

## 2022-09-23 NOTE — Telephone Encounter (Signed)
I have called the patient with the results of the low dose CT Chest. It was read as a LR 2, however there was a new ground-glass attenuation nodule in the right middle lobe , with a volume derived mean diameter of 23.4 mm. I have reviewed this with Dr. Jayme Cloud, and she recommended a 3 month follow up LDCT to re-evaluate for stability. He is  agreement with this plan. We also discussed that there was notation of CAD on the scan. He is on statin therapy and has been seen by cardiology Please place follow up LDCT due 10/2022, and fax results to PCP. Thanks so much.

## 2022-11-08 ENCOUNTER — Ambulatory Visit
Admission: RE | Admit: 2022-11-08 | Discharge: 2022-11-08 | Disposition: A | Discharge status: Home / Self Care | Payer: 59 | Source: Ambulatory Visit | Attending: Acute Care | Admitting: Acute Care

## 2022-11-08 DIAGNOSIS — Z87891 Personal history of nicotine dependence: Secondary | ICD-10-CM

## 2022-11-08 DIAGNOSIS — Z122 Encounter for screening for malignant neoplasm of respiratory organs: Secondary | ICD-10-CM

## 2022-11-08 DIAGNOSIS — F17210 Nicotine dependence, cigarettes, uncomplicated: Secondary | ICD-10-CM

## 2022-11-14 ENCOUNTER — Other Ambulatory Visit: Payer: Self-pay | Admitting: Acute Care

## 2022-11-14 DIAGNOSIS — Z87891 Personal history of nicotine dependence: Secondary | ICD-10-CM

## 2022-11-14 DIAGNOSIS — F17210 Nicotine dependence, cigarettes, uncomplicated: Secondary | ICD-10-CM

## 2022-11-14 DIAGNOSIS — Z122 Encounter for screening for malignant neoplasm of respiratory organs: Secondary | ICD-10-CM

## 2022-12-12 ENCOUNTER — Telehealth: Payer: Self-pay | Admitting: *Deleted

## 2022-12-12 NOTE — Telephone Encounter (Signed)
PT calling for CT results. He can see on [facility] but needs a more rudimentary explanation from a nurse. Pls call @ [phone]

## 2022-12-13 NOTE — Telephone Encounter (Signed)
Patient returned call and results of LDCT were reviewed by phone, using two patient identifiers. The area previously noted in the lung for follow up has resolved and is no longer seen. Will place order for yearly LDCT based upon recommendation.  Patient acknowledged understanding and had no questions.

## 2022-12-13 NOTE — Telephone Encounter (Signed)
Returned call to patient to review LDCT results. No answer. Left VM and call back number

## 2023-10-08 IMAGING — CT CT CHEST LUNG CANCER SCREENING LOW DOSE W/O CM
1 series · 10 of 10 positions shown, 13 images · non-contrast
Comparison: 05/20/2020.

CLINICAL DATA: Current smoker, 84 pack-year history.



[ct lung segmentation data · axial · 0.80mm/px · z∈[+1027,+1027]mm · 10 of 357 frames shown]
[frame 1/357  mediastinal]
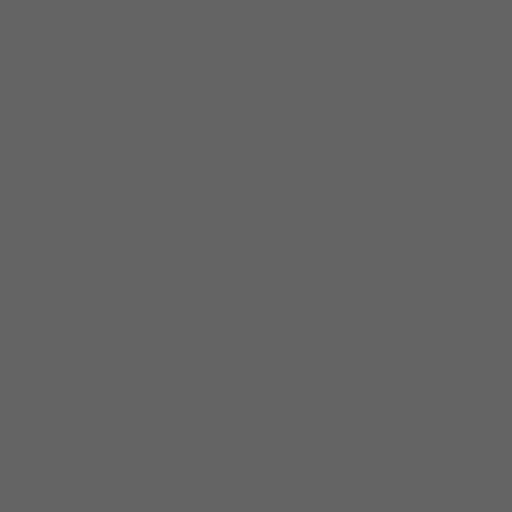
[frame 1/357  lung]
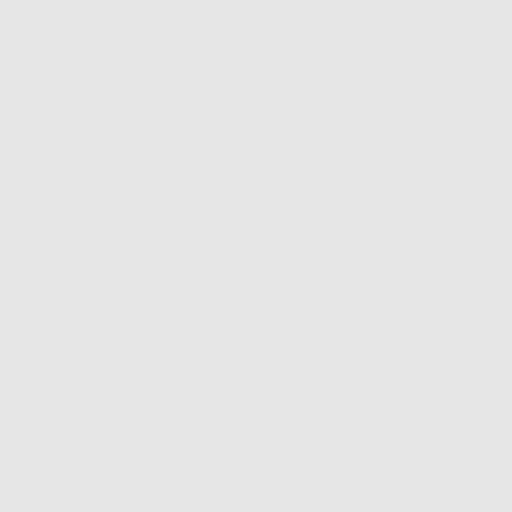
[frame 40/357  lung]
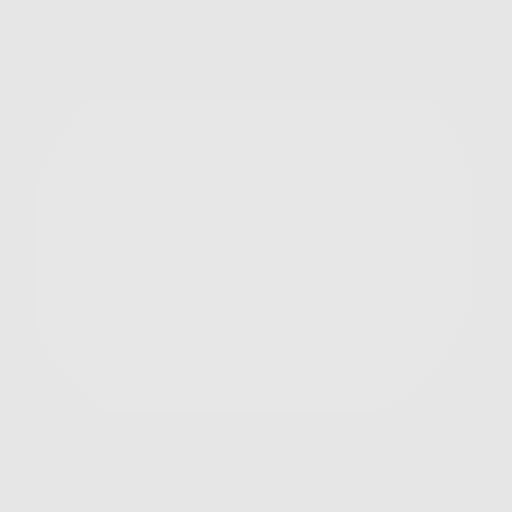
[frame 80/357  lung]
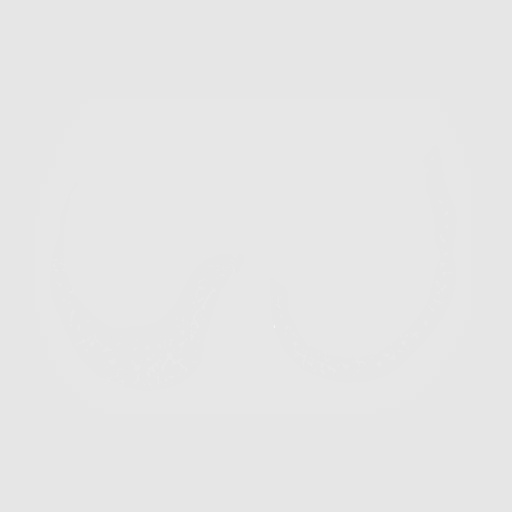
[frame 119/357  lung]
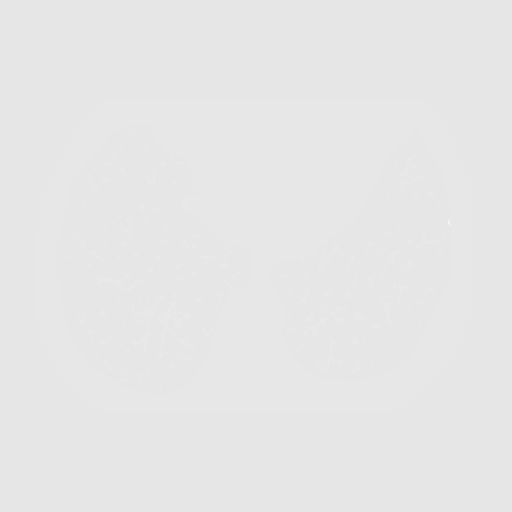
[frame 159/357  mediastinal]
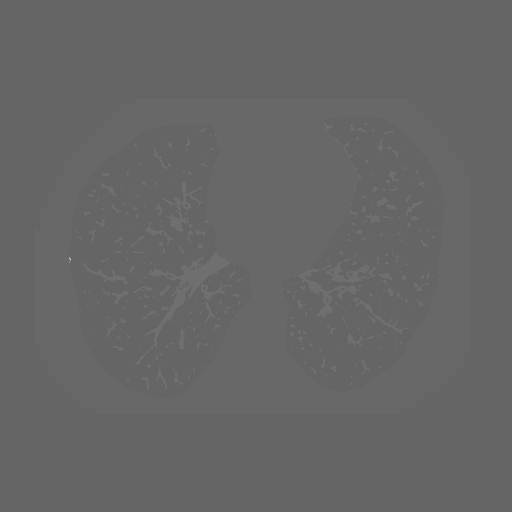
[frame 159/357  lung]
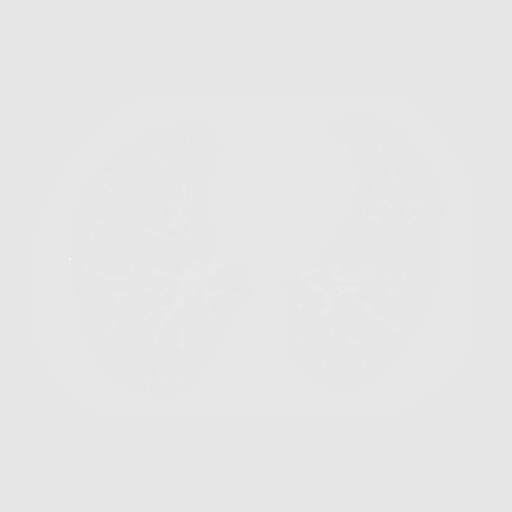
[frame 198/357  lung]
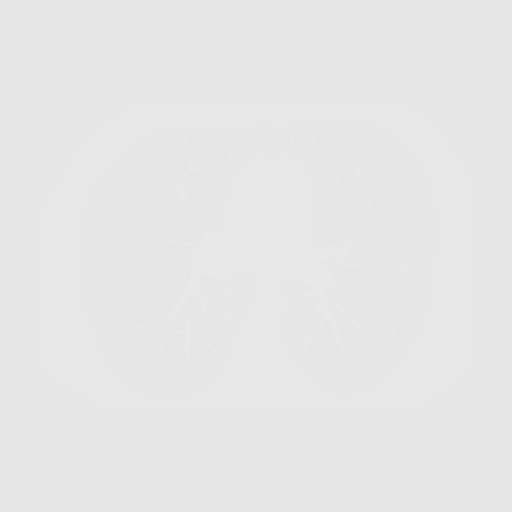
[frame 238/357  lung]
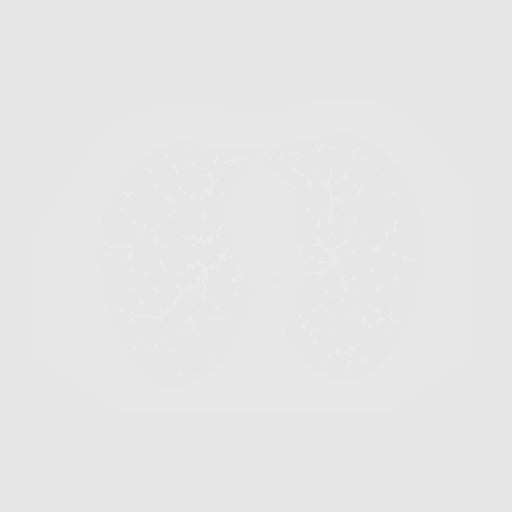
[frame 277/357  lung]
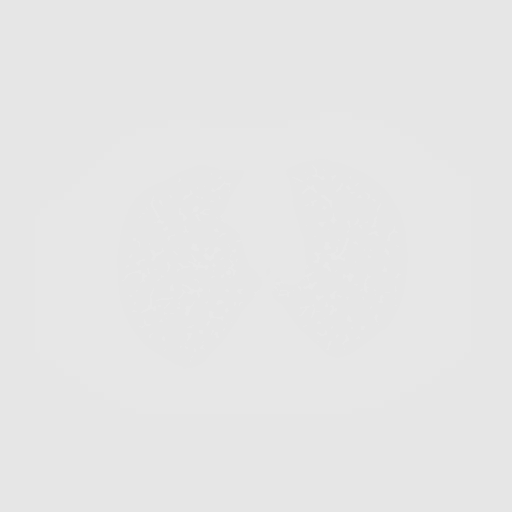
[frame 317/357  mediastinal]
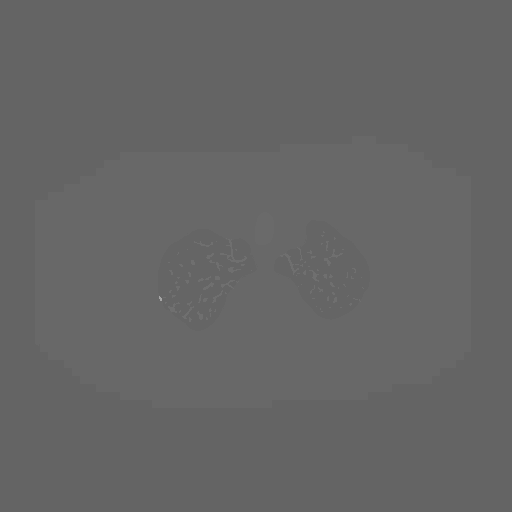
[frame 317/357  lung]
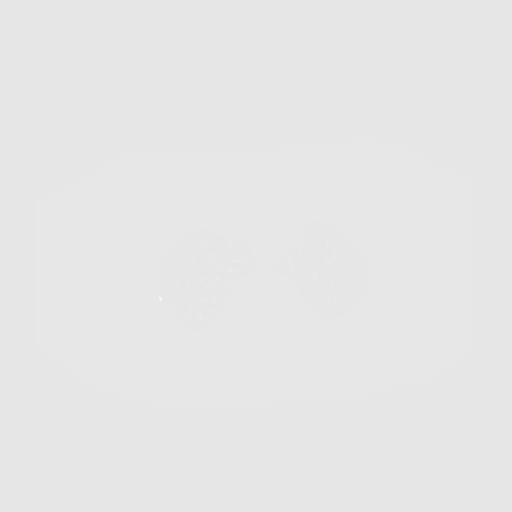
[frame 357/357  lung]
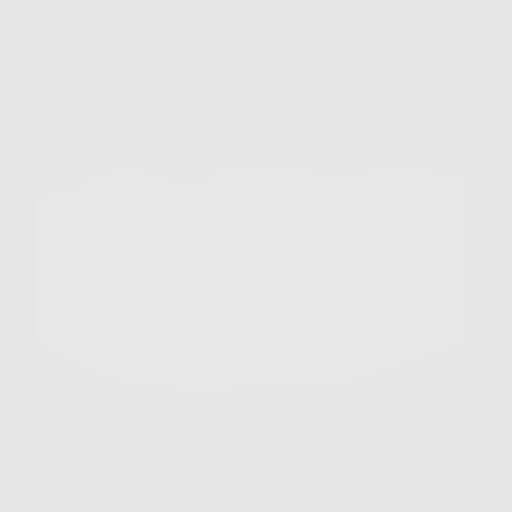

[10 of 10 positions shown; findings below may reference images not displayed]

FINDINGS: Cardiovascular: Coronary artery calcification. Heart size normal. No
pericardial effusion.

Mediastinum/Nodes: No pathologically enlarged mediastinal or
axillary lymph nodes. Hilar regions are difficult to definitively
evaluate without IV contrast but appear grossly unremarkable.
Esophagus is grossly unremarkable.

Lungs/Pleura: Biapical pleuroparenchymal scarring. Centrilobular and
paraseptal emphysema. Minimal bibasilar scarring. Pulmonary nodules
measure 6.9 mm or less in size, as before. No new pulmonary nodules.
No pleural fluid. Airway is unremarkable.

Upper Abdomen: Visualized portions of the liver, gallbladder,
adrenal glands, kidneys, spleen, pancreas, stomach and bowel are
grossly unremarkable. No upper abdominal adenopathy.

Musculoskeletal: Degenerative changes in the spine. Old left rib
fractures. Mild compression of the T8 inferior endplate, unchanged.
No worrisome lytic or sclerotic lesions.
IMPRESSION: 1. Lung-RADS 2, benign appearance or behavior. Continue annual
screening with low-dose chest CT without contrast in 12 months.
2. Coronary artery calcification.
3.  Emphysema ([id number]).

## 2023-11-09 ENCOUNTER — Other Ambulatory Visit

## 2023-11-09 ENCOUNTER — Ambulatory Visit
Admission: RE | Admit: 2023-11-09 | Discharge: 2023-11-09 | Disposition: A | Discharge status: Home / Self Care | Source: Ambulatory Visit | Attending: Acute Care | Admitting: Acute Care

## 2023-11-09 DIAGNOSIS — F17210 Nicotine dependence, cigarettes, uncomplicated: Secondary | ICD-10-CM

## 2023-11-09 DIAGNOSIS — Z87891 Personal history of nicotine dependence: Secondary | ICD-10-CM

## 2023-11-09 DIAGNOSIS — Z122 Encounter for screening for malignant neoplasm of respiratory organs: Secondary | ICD-10-CM

## 2023-11-20 ENCOUNTER — Telehealth: Payer: Self-pay

## 2023-11-20 ENCOUNTER — Telehealth: Payer: Self-pay | Admitting: Acute Care

## 2023-11-20 NOTE — Telephone Encounter (Signed)
 See provider note 11/20/2023

## 2023-11-20 NOTE — Telephone Encounter (Signed)
 Please call patient and let him know his low-dose screening CT was read as a lung RADS 4A.  Please let him know that he has got 1 nodule in the left lower lobe that has grown from 3.4 mm to 7.9 mm in the last year.  The nodule is still too small for reliable pet imaging. Plan will be for 82-month follow-up low-dose CT due on or around February 09, 2024 to reassess this nodule.  If the nodule has grown we will do a PET scan.  Historically this nodule has waxed and waned in size.  Please asked the patient if he has been sick.  Please fax results to PCP, let them know the plan is for a 59-month follow-up scan that will be due around February 09, 2024 and based on results of the scan we will determine whether or not to do additional imaging.  Thank you so much

## 2023-11-20 NOTE — Telephone Encounter (Signed)
 Call Report from Tiffany Previous 3.4 mm nodule now 7.9 mm since previous annual scan  IMPRESSION: 1. Lung-RADS 4A, suspicious. Follow up low-dose chest CT without contrast in 3 months (please use the following order, CT CHEST LCS NODULE FOLLOW-UP W/O CM) is recommended. Alternatively, PET may be considered when there is a solid component 8mm or larger. Interval growth of solid left lower lobe pulmonary nodule to 7.9 mm. 2. Two-vessel coronary atherosclerosis. 3. Aortic Atherosclerosis (ICD10-I70.0) and Emphysema (ICD10-J43.9).

## 2023-11-21 ENCOUNTER — Other Ambulatory Visit: Payer: Self-pay

## 2023-11-21 DIAGNOSIS — F17210 Nicotine dependence, cigarettes, uncomplicated: Secondary | ICD-10-CM

## 2023-11-21 DIAGNOSIS — Z87891 Personal history of nicotine dependence: Secondary | ICD-10-CM

## 2023-11-21 DIAGNOSIS — R911 Solitary pulmonary nodule: Secondary | ICD-10-CM

## 2023-11-21 DIAGNOSIS — Z122 Encounter for screening for malignant neoplasm of respiratory organs: Secondary | ICD-10-CM

## 2023-11-21 NOTE — Telephone Encounter (Signed)
 I have called and spoken with the patient and reviewed recent Lung CT results. He is in agreement to complete a 3 month follow scan scheduled for 02/14/2024 at GI. Order placed. Results and plan to PCP.

## 2024-01-29 ENCOUNTER — Ambulatory Visit (INDEPENDENT_AMBULATORY_CARE_PROVIDER_SITE_OTHER): Admitting: Pulmonary Disease

## 2024-01-29 ENCOUNTER — Encounter: Payer: Self-pay | Admitting: Pulmonary Disease

## 2024-01-29 VITALS — BP 130/70 | HR 66 | Temp 98.2°F | Ht 73.0 in | Wt 184.0 lb

## 2024-01-29 DIAGNOSIS — R911 Solitary pulmonary nodule: Secondary | ICD-10-CM | POA: Diagnosis not present

## 2024-01-29 DIAGNOSIS — F17210 Nicotine dependence, cigarettes, uncomplicated: Secondary | ICD-10-CM

## 2024-01-29 DIAGNOSIS — J432 Centrilobular emphysema: Secondary | ICD-10-CM

## 2024-01-29 DIAGNOSIS — J439 Emphysema, unspecified: Secondary | ICD-10-CM

## 2024-01-29 NOTE — Patient Instructions (Signed)
  VISIT SUMMARY: You came in today for a follow-up visit regarding the enlargement of a lung nodule. We discussed your lung health, including emphysema and your efforts to quit smoking.  YOUR PLAN: PULMONARY NODULE, LEFT LOWER LOBE: The nodule in your left lower lung has grown to 7.9 mm. It has varied in size in the past and is currently at the limit of detection for a PET scan. -We will monitor the nodule with a follow-up scan on February 09, 2024. -Depending on the results, we may consider a PET scan or biopsy.  EMPHYSEMA: You have emphysema, as shown in previous scans. The last lung function test was in 2017. -We will order a lung function test to assess your current lung health. -Please schedule this lung function test in the next few weeks.  TOBACCO USE DISORDER: You have a long history of smoking and are currently trying to quit using a Juul. -Consider using nicotine  patches or medications like Chantix to help you quit smoking. -Let us  know if you are interested in additional support for smoking cessation.                        Contains text generated by [employer].

## 2024-01-29 NOTE — Progress Notes (Addendum)
 Randy Davidson    [id number]    July 01, 1960  Primary Care Physician:Koirala, Dibas, MD  Referring Physician: Regino Slater, MD [address] [place],  [place] [address]  Chief complaint: Consult for lung nodule, emphysema   HPI: 63 y.o. who  has a past medical history of COPD (chronic obstructive pulmonary disease) (HCC), Coronary artery disease, Fatty liver, Hyperlipidemia, Hypertension, Liver disease, and RBBB.  Discussed the use of AI scribe software for clinical note transcription with the patient, who gave verbal consent to proceed.  History of Present Illness Randy Davidson is a 63 year old male who presents for follow-up after a recent scan showed enlargement of a lung nodule. He is accompanied by his wife, [name]. He was referred by his primary care physician for evaluation of an enlarged lung nodule.  Pulmonary nodule - Enlargement of left lower lobe lung nodule to 7.9 mm on recent imaging - Previous imaging showed nodule size fluctuating between 3 to 4 mm - Follow-up scan scheduled for February 09, 2024  Tobacco use - Significant smoking history: approximately 1.5 packs per day for 45 to 47 years - Currently attempting to quit smoking - Uses a Juul to reduce cigarette consumption  Dyspnea - Dyspnea occurs only with exertion at high altitudes (e.g., during hiking in [place] ) - No use of inhalers   Relevant Pulmonary history: Pets: Dog Occupation: Interior and spatial designer of IT for [employer] Exposures: No mold, hot tub, Jacuzzi.  No feather pillow or comforter No h/o chemo/XRT/amiodarone/macrodantin/MTX  No exposure to asbestos, silica or other organic allergens  Smoking history: 60-pack-year smoker.  Continues to smoke Travel history: No significant travel history Family history: Mother and father died of lung cancer.  There were smokers.   Outpatient Encounter Medications as of 01/29/2024  Medication Sig   atorvastatin  (LIPITOR) 20 MG tablet  Take 20 mg by mouth daily.    fexofenadine (ALLEGRA) 180 MG tablet Take 180 mg by mouth daily as needed for allergies or rhinitis.   fluticasone (FLONASE) 50 MCG/ACT nasal spray Place 2 sprays into both nostrils daily as needed for rhinitis.    folic acid  (FOLVITE ) 1 MG tablet Take 1 tablet (1 mg total) by mouth daily. (Patient not taking: Reported on 10/18/2018)   Multiple Vitamin (MULTIVITAMIN WITH MINERALS) TABS tablet Take 1 tablet by mouth daily. (Patient not taking: Reported on 10/18/2018)   nicotine  (NICODERM CQ  - DOSED IN MG/24 HOURS) 21 mg/24hr patch Place 1 patch (21 mg total) onto the skin daily. (Patient not taking: Reported on 10/18/2018)   pantoprazole  (PROTONIX ) 40 MG tablet Take 1 tablet (40 mg total) by mouth daily. (Patient not taking: Reported on 10/18/2018)   thiamine  100 MG tablet Take 1 tablet (100 mg total) by mouth daily. (Patient not taking: Reported on 10/18/2018)   No facility-administered encounter medications on file as of 01/29/2024.     Physical Exam: Today's Vitals   01/29/24 1433  BP: 130/70  Pulse: 66  Temp: 98.2 F (36.8 C)  TempSrc: Oral  SpO2: 97%  Weight: 184 lb (83.5 kg)  Height: 6' 1 (1.854 m)   Body mass index is 24.28 kg/m.  Physical Exam GEN: No acute distress. CV: Regular rate and rhythm, no murmurs. LUNGS: Clear to auscultation bilaterally, normal respiratory effort. SKIN JOINTS: Warm and dry, no rash.    Data Reviewed: Imaging: CT chest 11/09/2023-mild paraseptal and centrilobular emphysema, bronchial wall thickening.  Left lower lobe pulmonary nodule increased from  3.4 mm to 7.9 mm. I reviewed the images personally.  PFTs: 07/22/2015 FVC 5.25 [96%], FEV1 3.49 [83%], F/F67, TLC 8.80 [116%], DLCO 30.04 [82%] Mild obstruction  Labs:  Assessment and Plan Assessment & Plan Pulmonary nodule, left lower lobe Pulmonary nodule in the left lower lobe has shown variability in size, currently measuring 7.9 mm, previously between 3-4 mm.  It is at the limit of detection for a PET scan. Biopsy is an option but not currently planned. - Monitor with follow-up scan on February 09, 2024, to assess changes in size or characteristics. - Consider PET scan or biopsy based on follow-up scan results.  Emphysema, mild COPD Emphysema is present as indicated by previous scans. Lung function tests were last conducted in 2017. Reassessment is important to evaluate current status and impact on lung health, especially if surgical intervention is considered for the pulmonary nodule. - Schedule lung function test in the next few weeks.  Tobacco use disorder Long history of smoking, approximately 1.5 packs per day for 45-47 years. Currently in the process of quitting, using a Juul to reduce cigarette consumption. Discussed potential benefits of smoking cessation and availability of nicotine  replacement therapies such as nicotine  patches or medications like Chantix.  Time spent counseling-5 minutes.  Reassess at return visit - Offer nicotine  patches or Chantix if interested in additional support for smoking cessation.   Recommendations: Follow-up CT scan as scheduled in October 10 PFTs Smoking cessation  Vertie Dibbern MD [facility] 01/29/2024, 2:38 PM  CC: Regino Slater, MD

## 2024-02-14 ENCOUNTER — Ambulatory Visit
Admission: RE | Admit: 2024-02-14 | Discharge: 2024-02-14 | Disposition: A | Discharge status: Home / Self Care | Source: Ambulatory Visit | Attending: Acute Care | Admitting: Acute Care

## 2024-02-14 DIAGNOSIS — Z87891 Personal history of nicotine dependence: Secondary | ICD-10-CM

## 2024-02-14 DIAGNOSIS — F17210 Nicotine dependence, cigarettes, uncomplicated: Secondary | ICD-10-CM

## 2024-02-14 DIAGNOSIS — Z122 Encounter for screening for malignant neoplasm of respiratory organs: Secondary | ICD-10-CM

## 2024-02-14 DIAGNOSIS — R911 Solitary pulmonary nodule: Secondary | ICD-10-CM

## 2024-02-19 ENCOUNTER — Telehealth: Payer: Self-pay

## 2024-02-19 ENCOUNTER — Telehealth: Payer: Self-pay | Admitting: Acute Care

## 2024-02-19 NOTE — Telephone Encounter (Signed)
 This nodule has increased in size again. Now up to 10 mm from 7.9 mm. He needs a PET scan and follow up pulmonary Nodule consult with Lauraine NP or Dr. Shelah. If neither of us  have appointments soon, please schedule with Dewald or another nodule MD. Please call patient and let them know next step is a PET scan and follow up in the clinic with one of the lung nodule providers. Please fax results top PCP and let them know plan.  Thanks so much.

## 2024-02-19 NOTE — Telephone Encounter (Signed)
 Call Report from Cheryl/spoke with Tiffany to confirm received.  IMPRESSION: 1. Lung-RADS 4B, suspicious. Additional imaging evaluation or consultation with Pulmonology or Thoracic Surgery recommended. Interval increase in size of previously noted left lower lobe nodule, measuring 10.0 mm, previously 7.9 mm. 2. A few new basilar right lower lobe nodule measuring up to 2.8 mm are new. A few scattered pulmonary nodules are no longer seen. Remainder of the pulmonary nodules are unchanged. 3. Aortic Atherosclerosis (ICD10-I70.0) and Emphysema (ICD10-J43.9). Coronary artery calcifications. Assessment for potential risk factor modification, dietary therapy or pharmacologic therapy may be warranted, if clinically indicated.

## 2024-02-19 NOTE — Telephone Encounter (Signed)
 Called and left Vm for pt.

## 2024-02-19 NOTE — Telephone Encounter (Signed)
 RN LVM to review results, documented on additional note.

## 2024-02-20 ENCOUNTER — Other Ambulatory Visit: Payer: Self-pay

## 2024-02-20 DIAGNOSIS — Z87891 Personal history of nicotine dependence: Secondary | ICD-10-CM

## 2024-02-20 DIAGNOSIS — Z122 Encounter for screening for malignant neoplasm of respiratory organs: Secondary | ICD-10-CM

## 2024-02-20 DIAGNOSIS — F17210 Nicotine dependence, cigarettes, uncomplicated: Secondary | ICD-10-CM

## 2024-02-20 DIAGNOSIS — R911 Solitary pulmonary nodule: Secondary | ICD-10-CM

## 2024-02-20 NOTE — Telephone Encounter (Signed)
 I have called and spoken with the patient. Reviewed recent Lung CT results. He will complete a PET scan due to nodule growth, now 10 mm and follow up in one week for results. Order placed. Results and plan to PCP and Dr, Windle Rake, RN

## 2024-03-21 ENCOUNTER — Encounter (HOSPITAL_COMMUNITY)
Admission: RE | Admit: 2024-03-21 | Discharge: 2024-03-21 | Disposition: A | Discharge status: Home / Self Care | Source: Ambulatory Visit | Attending: Acute Care | Admitting: Acute Care

## 2024-03-21 DIAGNOSIS — Z122 Encounter for screening for malignant neoplasm of respiratory organs: Secondary | ICD-10-CM | POA: Insufficient documentation

## 2024-03-21 DIAGNOSIS — F17210 Nicotine dependence, cigarettes, uncomplicated: Secondary | ICD-10-CM | POA: Insufficient documentation

## 2024-03-21 DIAGNOSIS — Z87891 Personal history of nicotine dependence: Secondary | ICD-10-CM | POA: Diagnosis present

## 2024-03-21 DIAGNOSIS — R911 Solitary pulmonary nodule: Secondary | ICD-10-CM | POA: Diagnosis present

## 2024-03-21 LAB — GLUCOSE, CAPILLARY: Glucose-Capillary: 110 mg/dL — ABNORMAL HIGH (ref 70–99)

## 2024-03-21 MED ORDER — FLUDEOXYGLUCOSE F - 18 (FDG) INJECTION
9.1000 | Freq: Once | INTRAVENOUS | Status: AC | PRN
  Administered 2024-03-21: 9.2 via INTRAVENOUS

## 2024-03-22 ENCOUNTER — Encounter

## 2024-04-03 ENCOUNTER — Encounter: Payer: Self-pay | Admitting: Acute Care

## 2024-04-03 ENCOUNTER — Ambulatory Visit: Admitting: Acute Care

## 2024-04-03 VITALS — BP 127/66 | HR 65 | Temp 97.9°F | Ht 73.0 in | Wt 186.0 lb

## 2024-04-03 DIAGNOSIS — F17210 Nicotine dependence, cigarettes, uncomplicated: Secondary | ICD-10-CM

## 2024-04-03 DIAGNOSIS — R911 Solitary pulmonary nodule: Secondary | ICD-10-CM

## 2024-04-03 DIAGNOSIS — J449 Chronic obstructive pulmonary disease, unspecified: Secondary | ICD-10-CM

## 2024-04-03 DIAGNOSIS — F17200 Nicotine dependence, unspecified, uncomplicated: Secondary | ICD-10-CM

## 2024-04-03 DIAGNOSIS — R9389 Abnormal findings on diagnostic imaging of other specified body structures: Secondary | ICD-10-CM

## 2024-04-03 NOTE — Patient Instructions (Addendum)
 It is good to see you today. We have reviewed your PET scan. The metabolic activity of the left lower lobe nodule is low, which is reassuring. We will do a 3 month follow up scan to maintain a close watch.  If this nodule does continue to grow, we will talk about biopsy. Call  if you have any unexplained weight loss or blood in your sputum when you cough, so we can see you sooner.  Call if you need us  sooner. PFT's as ordered. Happy Holidays Please contact office for sooner follow up if symptoms do not improve or worsen or seek emergency care

## 2024-04-03 NOTE — Progress Notes (Signed)
 History of Present Illness Randy Davidson is a 63 y.o. male current every day smoker followed through the lung cancer screening program. Presents today for abnormal chest imaging.    04/03/2024 Discussed the use of AI scribe software for clinical note transcription with the patient, who gave verbal consent to proceed.  History of Present Illness Pt. Presents for consult after abnormal Low Dose Ct Chest. PET scan was done  to further evaluate the nodule which showed the nodule of concern had an SUV of 1.3. While this is reassuring, the fact that the nodule has grown at 3 month intervals over the last 2 scans is concerning. We discussed doing a 3 month follow up CT, Due 06/2024. If the nodule has continued growth in 3 months we will plan on biopsy at that point. Pt. Is in agreement with this plan.     Test Results: PET scan 03/21/2024 CHEST: There are no hypermetabolic mediastinal, hilar or axillary lymph nodes. The pulmonary nodule of interest is not significantly hypermetabolic. 1.0 cm and SUV 1.3 on image 40 / 7. Centrilobular emphysema. Aortic and LAD coronary artery calcification. The left lower lobe nodule of interest is not hypermetabolic. Especially given its size, at the low end of PET resolution, this remains indeterminate, and non-FDG avid primary malignancy remains a differential consideration. Consider chest CT follow up at 3 to 6 months. 2. Incidental findings, including: Aortic atherosclerosis (icd10-i70.0), coronary artery atherosclerosis, and emphysema (icd10-j43.9). Sinus disease.  LDCT 02/14/2024  Lung-RADS 4B, suspicious. Additional imaging evaluation or consultation with Pulmonology or Thoracic Surgery recommended. Interval increase in size of previously noted left lower lobe nodule, measuring 10.0 mm, previously 7.9 mm. 2. A few new basilar right lower lobe nodule measuring up to 2.8 mm are new. A few scattered pulmonary nodules are no longer seen. Remainder of the  pulmonary nodules are unchanged. 3. Aortic Atherosclerosis (ICD10-I70.0) and Emphysema (ICD10-J43.9). Coronary artery calcifications. Assessment for potential risk factor modification, dietary therapy or pharmacologic therapy may be warranted, if clinically indicated.  LDCT 11/09/2023 Lung-RADS 4A, suspicious. Follow up low-dose chest CT without contrast in 3 months (please use the following order, CT CHEST LCS NODULE FOLLOW-UP W/O CM) is recommended. Alternatively, PET may be considered when there is a solid component 8mm or larger. Interval growth of solid left lower lobe pulmonary nodule to 7.9 mm.    Latest Ref Rng & Units 10/24/2018    6:25 AM 04/17/2017    7:38 AM 04/16/2017    3:20 PM  CBC  WBC 4.0 - 10.5 K/uL 7.6  7.9  10.2   Hemoglobin 13.0 - 17.0 g/dL 85.7  86.8  85.0   Hematocrit 39.0 - 52.0 % 40.2  35.9  40.5   Platelets 150 - 400 K/uL 167  143  203        Latest Ref Rng & Units 10/24/2018    6:25 AM 04/17/2017   12:20 PM 04/17/2017    7:38 AM  BMP  Glucose 70 - 99 mg/dL 897  895  97   BUN 6 - 20 mg/dL 8  51  57   Creatinine 0.61 - 1.24 mg/dL 8.93  8.30  7.69   Sodium 135 - 145 mmol/L 137  131  131   Potassium 3.5 - 5.1 mmol/L 3.9  3.3  3.1   Chloride 98 - 111 mmol/L 105  96  99   CO2 22 - 32 mmol/L 21  23  21    Calcium  8.9 - 10.3 mg/dL 9.0  8.5  8.3     BNP No results found for: BNP  ProBNP No results found for: PROBNP  PFT    Component Value Date/Time   FEV1PRE 3.15 07/22/2015 1158   FEV1POST 3.49 07/22/2015 1158   FVCPRE 5.23 07/22/2015 1158   FVCPOST 5.25 07/22/2015 1158   TLC 8.80 07/22/2015 1158   DLCOUNC 30.04 07/22/2015 1158   PREFEV1FVCRT 60 07/22/2015 1158   PSTFEV1FVCRT 67 07/22/2015 1158    NM PET Image Initial (PI) Skull Base To Thigh Result Date: 03/26/2024 EXAM: PET AND CT SKULL BASE TO MID THIGH 03/21/2024 10:14:43 AM TECHNIQUE: RADIOPHARMACEUTICAL: 9.2 mCi F-18 FDG Uptake time 60 minutes. Glucose level 110 mg/dl. Blood pool  SUV 2.3. PET imaging was acquired from the base of the skull to the mid thighs. Non-contrast enhanced computed tomography was obtained for attenuation correction and anatomic localization. COMPARISON: CT dated 02/14/2024. CLINICAL HISTORY: Lung nodule, > 8mm; Nodule growth, now 10 mm. FINDINGS: HEAD AND NECK: No hypermetabolic cervical lymph nodes are identified. Left maxillary sinus mucous retention cyst or polyp of 1.4 cm. Probable sebaceous cyst superficial to the right maxillary sinus at 1.5 cm on image 19 / 4. Bilateral carotid atherosclerosis. CHEST: There are no hypermetabolic mediastinal, hilar or axillary lymph nodes. The pulmonary nodule of interest is not significantly hypermetabolic. 1.0 cm and SUV 1.3 on image 40 / 7. Centrilobular emphysema. Aortic and LAD coronary artery calcification. ABDOMEN AND PELVIS: There is no hypermetabolic activity within the liver, adrenal glands, spleen or pancreas. There is no hypermetabolic nodal activity in the abdomen or pelvis. Central right hepatic lobe subcentimeter cyst. Normal adrenal glands. Scattered colonic diverticula. Penile calcifications including on image 202 / 4. Physiologic activity within the gastrointestinal and genitourinary systems. BONES AND SOFT TISSUE: There is no hypermetabolic activity to suggest osseous metastatic disease. Old left rib fracture. No metabolically active aggressive osseous lesion. IMPRESSION: 1. The left lower lobe nodule of interest is not hypermetabolic. Especially given its size, at the low end of PET resolution, this remains indeterminate, and non-FDG avid primary malignancy remains a differential consideration. Consider chest CT follow up at 3 to 6 months. 2. Incidental findings, including: Aortic atherosclerosis (icd10-i70.0), coronary artery atherosclerosis, and emphysema (icd10-j43.9). Sinus disease. 3. Penile calcifications are nonspecific but can be seen with Peyronie's disease. Electronically signed by: Rockey Kilts MD  03/26/2024 10:46 AM EST RP Workstation: [id number]     Past medical hx Past Medical History:  Diagnosis Date   COPD (chronic obstructive pulmonary disease) (HCC)    Coronary artery disease    Fatty liver    Hyperlipidemia    Hypertension    Liver disease    RBBB      Social History   Tobacco Use   Smoking status: Every Day    Current packs/day: 1.50    Average packs/day: 1.5 packs/day for 40.0 years (60.0 ttl pk-yrs)    Types: Cigarettes   Smokeless tobacco: Never   Tobacco comments:    Started smoking at 15.Smokes a pack a day 04/03/2024 KRD  Vaping Use   Vaping status: Unknown  Substance Use Topics   Alcohol use: Yes    Alcohol/week: 36.0 standard drinks of alcohol    Types: 36 Cans of beer per week   Drug use: Yes    Types: Marijuana    Comment: last time 10/22/2018    Mr.Grinage reports that he has been smoking cigarettes. He has a 60 pack-year smoking history. He has never used smokeless tobacco. He reports current  alcohol use of about 36.0 standard drinks of alcohol per week. He reports current drug use. Drug: Marijuana.  Tobacco Cessation: Ready to quit: Not Answered Counseling given: Not Answered Tobacco comments: Started smoking at 15.Smokes a pack a day 04/03/2024 KRD Current everyday smoker smokes 1 pack/day Patient has been counseled to quit x 3 minutes  Past surgical hx, Family hx, Social hx all reviewed.  Current Outpatient Medications on File Prior to Visit  Medication Sig   atorvastatin  (LIPITOR) 20 MG tablet Take 20 mg by mouth daily.    fexofenadine (ALLEGRA) 180 MG tablet Take 180 mg by mouth daily as needed for allergies or rhinitis.   fluticasone (FLONASE) 50 MCG/ACT nasal spray Place 2 sprays into both nostrils daily as needed for rhinitis.    No current facility-administered medications on file prior to visit.     No Known Allergies  Review Of Systems:  Constitutional:   No  weight loss, night sweats,  Fevers, chills, fatigue, or   lassitude.  HEENT:   No headaches,  Difficulty swallowing,  Tooth/dental problems, or  Sore throat,                No sneezing, itching, ear ache, nasal congestion, post nasal drip,   CV:  No chest pain,  Orthopnea, PND, swelling in lower extremities, anasarca, dizziness, palpitations, syncope.   GI  No heartburn, indigestion, abdominal pain, nausea, vomiting, diarrhea, change in bowel habits, loss of appetite, bloody stools.   Resp: + shortness of breath with exertion less at rest.   + Baseline excess mucus, + Baseline  productive cough,  No non-productive cough,  No coughing up of blood.  No change in color of mucus.  No wheezing.  No chest wall deformity  Skin: no rash or lesions.  GU: no dysuria, change in color of urine, no urgency or frequency.  No flank pain, no hematuria   MS:  No joint pain or swelling.  No decreased range of motion.  No back pain.  Psych:  No change in mood or affect. No depression or anxiety.  No memory loss.   Vital Signs BP 127/66   Pulse 65   Temp 97.9 F (36.6 C) (Oral)   Ht 6' 1 (1.854 m)   Wt 186 lb (84.4 kg)   SpO2 95%   BMI 24.54 kg/m    Physical Exam:  General- No distress,  A&Ox3, pleasant and appropriate ENT: No sinus tenderness, TM clear, pale nasal mucosa, no oral exudate,no post nasal drip, no LAN Cardiac: S1, S2, regular rate and rhythm, no murmur Chest: No wheeze/ rales/ dullness; no accessory muscle use, no nasal flaring, no sternal retractions, slightly diminished per bases Abd.: Soft Non-tender, nondistended, bowel sounds positive,Body mass index is 24.54 kg/m.  Ext: No clubbing cyanosis, edema, no obvious deformities Neuro:  normal strength, moving all extremities x 4, alert and oriented x 3, Skin: No rashes, warm and dry, no obvious skin lesions Psych: normal mood and behavior  Physical Exam    Assessment/Plan Slowly growing left lower lobe pulmonary nodule Current everyday smoker Minimal FDG uptake on PET Plan We  have reviewed your PET scan. The metabolic activity of the left lower lobe nodule is low, which is reassuring. We will do a 3 month follow up scan to maintain a close watch.  If this nodule does continue to grow, we will talk about biopsy. Call  if you have any unexplained weight loss or blood in your sputum when you cough, so  we can see you sooner.  Call if you need us  sooner. PFT's as ordered. Happy Holidays Please contact office for sooner follow up if symptoms do not improve or worsen or seek emergency care    I spent 20 minutes dedicated to the care of this patient on the date of this encounter to include pre-visit review of records, face-to-face time with the patient discussing conditions above, post visit ordering of testing, clinical documentation with the electronic health record, making appropriate referrals as documented, and communicating necessary information to the patient's healthcare team.  Assessment & Plan        Lauraine JULIANNA Lites, NP 04/03/2024  8:36 AM

## 2024-05-15 ENCOUNTER — Encounter

## 2024-05-22 ENCOUNTER — Telehealth: Payer: Self-pay

## 2024-05-22 NOTE — Telephone Encounter (Signed)
 Returned call. LVM to call direct line [phone] and reschedule 3/9 appt.   Copied from CRM #[id number]. Topic: Appointments - Scheduling Inquiry for Clinic >> May 21, 2024  2:55 PM Corean SAUNDERS wrote: Reason for CRM: Please call patient back as he needs to reschedule his appointment with Lauraine Lites on 3/9

## 2024-06-07 ENCOUNTER — Encounter

## 2024-06-24 ENCOUNTER — Inpatient Hospital Stay: Admission: RE | Admit: 2024-06-24

## 2024-07-05 ENCOUNTER — Encounter

## 2024-07-08 ENCOUNTER — Ambulatory Visit: Admitting: Acute Care
